# Patient Record
Sex: Female | Born: 1935 | Race: White | Hispanic: No | Smoking: Never smoker
Health system: Southern US, Community
[De-identification: ages and names within clinical notes are randomized; demographics above are authoritative.]

## PROBLEM LIST (undated history)

## (undated) DIAGNOSIS — M1990 Unspecified osteoarthritis, unspecified site: Secondary | ICD-10-CM

## (undated) DIAGNOSIS — I872 Venous insufficiency (chronic) (peripheral): Secondary | ICD-10-CM

## (undated) DIAGNOSIS — Z860100 Personal history of colon polyps, unspecified: Secondary | ICD-10-CM

## (undated) DIAGNOSIS — L97329 Non-pressure chronic ulcer of left ankle with unspecified severity: Secondary | ICD-10-CM

## (undated) DIAGNOSIS — J45909 Unspecified asthma, uncomplicated: Secondary | ICD-10-CM

## (undated) DIAGNOSIS — D6861 Antiphospholipid syndrome: Secondary | ICD-10-CM

## (undated) DIAGNOSIS — I272 Pulmonary hypertension, unspecified: Secondary | ICD-10-CM

## (undated) DIAGNOSIS — I87009 Postthrombotic syndrome without complications of unspecified extremity: Secondary | ICD-10-CM

## (undated) DIAGNOSIS — Z86718 Personal history of other venous thrombosis and embolism: Secondary | ICD-10-CM

## (undated) DIAGNOSIS — I493 Ventricular premature depolarization: Secondary | ICD-10-CM

## (undated) DIAGNOSIS — G629 Polyneuropathy, unspecified: Secondary | ICD-10-CM

## (undated) DIAGNOSIS — D649 Anemia, unspecified: Secondary | ICD-10-CM

## (undated) DIAGNOSIS — R0600 Dyspnea, unspecified: Secondary | ICD-10-CM

## (undated) DIAGNOSIS — E785 Hyperlipidemia, unspecified: Secondary | ICD-10-CM

## (undated) DIAGNOSIS — R5383 Other fatigue: Secondary | ICD-10-CM

## (undated) DIAGNOSIS — I83023 Varicose veins of left lower extremity with ulcer of ankle: Secondary | ICD-10-CM

## (undated) DIAGNOSIS — I499 Cardiac arrhythmia, unspecified: Secondary | ICD-10-CM

## (undated) HISTORY — PX: ELBOW SURGERY: SHX618

## (undated) HISTORY — PX: CHOLECYSTECTOMY: SHX55

## (undated) HISTORY — PX: COLONOSCOPY: SHX174

## (undated) HISTORY — PX: UPPER GI ENDOSCOPY: SHX6162

## (undated) HISTORY — PX: TOTAL HIP ARTHROPLASTY: SHX124

---

## 2004-02-04 HISTORY — PX: COLON SURGERY: SHX602

## 2009-01-03 ENCOUNTER — Emergency Department (HOSPITAL_BASED_OUTPATIENT_CLINIC_OR_DEPARTMENT_OTHER): Admission: EM | Admit: 2009-01-03 | Discharge: 2009-01-04 | Payer: Self-pay | Admitting: Emergency Medicine

## 2009-01-03 ENCOUNTER — Ambulatory Visit: Payer: Self-pay | Admitting: Interventional Radiology

## 2010-05-07 LAB — URINALYSIS, ROUTINE W REFLEX MICROSCOPIC
Ketones, ur: NEGATIVE mg/dL
Nitrite: NEGATIVE
Protein, ur: NEGATIVE mg/dL
Urobilinogen, UA: 0.2 mg/dL (ref 0.0–1.0)

## 2010-05-07 LAB — DIFFERENTIAL
Basophils Absolute: 0.3 10*3/uL — ABNORMAL HIGH (ref 0.0–0.1)
Lymphocytes Relative: 31 % (ref 12–46)
Monocytes Absolute: 0.7 10*3/uL (ref 0.1–1.0)
Neutro Abs: 4.5 10*3/uL (ref 1.7–7.7)
Neutrophils Relative %: 53 % (ref 43–77)

## 2010-05-07 LAB — POCT CARDIAC MARKERS
Myoglobin, poc: 60.7 ng/mL (ref 12–200)
Myoglobin, poc: 69.7 ng/mL (ref 12–200)
Troponin i, poc: <0.05 ng/mL (ref 0.00–0.09)

## 2010-05-07 LAB — APTT: aPTT: 26 seconds (ref 24–37)

## 2010-05-07 LAB — COMPREHENSIVE METABOLIC PANEL
AST: 23 U/L (ref 0–37)
CO2: 25 mEq/L (ref 19–32)
Calcium: 9.3 mg/dL (ref 8.4–10.5)
Creatinine, Ser: 0.8 mg/dL (ref 0.4–1.2)
GFR calc non Af Amer: >60 mL/min (ref >60)

## 2010-05-07 LAB — CBC
HCT: 39.3 % (ref 36.0–46.0)
MCV: 88.3 fL (ref 78.0–100.0)
Platelets: 198 10*3/uL (ref 150–400)
WBC: 8.4 10*3/uL (ref 4.0–10.5)

## 2010-05-07 LAB — POCT B-TYPE NATRIURETIC PEPTIDE (BNP): B Natriuretic Peptide, POC: 48.1 pg/mL (ref 0–100)

## 2010-05-07 LAB — PROTIME-INR
INR: 0.99 (ref 0.00–1.49)
Prothrombin Time: 13 seconds (ref 11.6–15.2)

## 2010-05-07 LAB — URINE CULTURE
Colony Count: NO GROWTH
Culture: NO GROWTH

## 2013-05-24 ENCOUNTER — Emergency Department (INDEPENDENT_AMBULATORY_CARE_PROVIDER_SITE_OTHER): Payer: Medicare Other

## 2013-05-24 ENCOUNTER — Encounter: Payer: Self-pay | Admitting: Emergency Medicine

## 2013-05-24 ENCOUNTER — Emergency Department
Admission: EM | Admit: 2013-05-24 | Discharge: 2013-05-24 | Disposition: A | Discharge status: Home / Self Care | Payer: Medicare Other | Source: Home / Self Care | Attending: Emergency Medicine | Admitting: Emergency Medicine

## 2013-05-24 DIAGNOSIS — M25569 Pain in unspecified knee: Secondary | ICD-10-CM

## 2013-05-24 DIAGNOSIS — IMO0002 Reserved for concepts with insufficient information to code with codable children: Secondary | ICD-10-CM

## 2013-05-24 DIAGNOSIS — M7052 Other bursitis of knee, left knee: Secondary | ICD-10-CM

## 2013-05-24 HISTORY — DX: Personal history of other venous thrombosis and embolism: Z86.718

## 2013-05-24 HISTORY — DX: Hyperlipidemia, unspecified: E78.5

## 2013-05-24 MED ORDER — PREDNISONE 20 MG PO TABS: 20.0000 mg | ORAL_TABLET | Freq: Two times a day (BID) | ORAL | Status: DC

## 2013-05-24 NOTE — ED Notes (Signed)
Makayla ForestShirley reports a pop in her left medial knee 2 weeks ago. She has swelling and pain. Pain is worse to touch. She has a hx of present blood clots in her left knee which she takes Coumadin for. She was seen @ [facility] 1 week ago and x-ray done, normal.

## 2013-05-24 NOTE — ED Provider Notes (Signed)
CSN: [id number]     Arrival date & time 05/24/13  [id number] History   First MD Initiated Contact with Patient 05/24/13 [phone]     Chief Complaint  Patient presents with  . Knee Pain    medial left knee    Patient is a 80 y.o. female presenting with knee pain. The history is provided by the patient.  Knee Pain Location:  Knee Time since incident:  2 weeks Injury: yes   Mechanism of injury comment:  Felt a pop left inferomedial knee while walking down a ramp Pain details:    Quality:  Dull and aching   Radiates to:  Does not radiate   Severity:  Moderate   Onset quality:  Sudden   Progression:  Unchanged Chronicity:  New Prior injury to area:  Unable to specify Relieved by:  Rest Worsened by:  Activity Ineffective treatments:  Acetaminophen Associated symptoms: decreased ROM, stiffness and swelling (Minimal.)   Associated symptoms: no back pain, no fatigue (No new fatigue), no fever, no muscle weakness, no neck pain, no numbness and no tingling   Risk factors: no known bone disorder    The pain is worse when she walks on the left knee, but she is able to weight-bear . Denies locking of left knee or giving out.  She states she was seen at [facility] a week and a half ago with negative x-ray. No other details from that visit available.  Significant past medical history, per patient, of left leg DVT 2 years ago postop hip surgery. She states she was evaluated and still follows up with Dr. Abbe AmsterdamHopkins, a specialist, and consideration was given to stopping the Coumadin, but ultimately the Coumadin was continued for prophylaxis according to the patient. She states her PCP is Dr. Sharee PimpleJudge, who monitors and prescribes Coumadin and checks protimes every month, which have been therapeutic. Past Medical History  Diagnosis Date  . Hyperlipidemia   . History of blood clots    Past Surgical History  Procedure Laterality Date  . Total hip arthroplasty    . Cholecystectomy    . Elbow surgery      Family History  Problem Relation Age of Onset  . Heart disease Mother   . Cancer Father   . Dementia Sister   . Cancer Sister     breast CA  . Diabetes Brother    History  Substance Use Topics  . Smoking status: Never Smoker   . Smokeless tobacco: Never Used  . Alcohol Use: No   OB History   Grav Para Term Preterm Abortions TAB SAB Ect Mult Living                 Review of Systems  Constitutional: Negative for fever and fatigue (No new fatigue).  Musculoskeletal: Positive for stiffness. Negative for back pain and neck pain.  All other systems reviewed and are negative.   Allergies  Penicillins  Home Medications   Prior to Admission medications   Medication Sig Start Date End Date Taking? Authorizing Provider  Biotin 300 MCG TABS Take by mouth.   Yes Historical Provider, MD  clindamycin (CLEOCIN) 300 MG capsule Take 300 mg by mouth 3 (three) times daily. "for dental work"   Yes Historical Provider, MD  levothyroxine (SYNTHROID, LEVOTHROID) 88 MCG tablet Take 88 mcg by mouth daily before breakfast.   Yes Historical Provider, MD  Omega-3 Fatty Acids (FISH OIL) 500 MG CAPS Take by mouth.   Yes Historical Provider, MD  warfarin (  COUMADIN) 5 MG tablet Take 5 mg by mouth daily.   Yes Historical Provider, MD   BP 156/83  Pulse 80  Resp 14  Ht 5\' 5"  (1.651 m)  Wt 155 lb (70.308 kg)  BMI 25.79 kg/m2  SpO2 100% Physical Exam  Nursing note and vitals reviewed. Constitutional: She is oriented to person, place, and time. She appears well-developed and well-nourished. No distress.  Alert, cooperative female. Appears younger than 80 years old.  HENT:  Head: Normocephalic and atraumatic.  Eyes: Conjunctivae and EOM are normal. Pupils are equal, round, and reactive to light. No scleral icterus.  Neck: Normal range of motion.  Cardiovascular: Normal rate.   Pulmonary/Chest: Effort normal.  Abdominal: She exhibits no distension.  Musculoskeletal: Normal range of motion.        Left knee: She exhibits normal range of motion, no ecchymosis, no laceration, no erythema and normal patellar mobility. No medial joint line, no lateral joint line and no patellar tendon tenderness noted.       Left upper leg: Normal. She exhibits no tenderness, no swelling and no edema.       Legs: Mild swelling, moderate to severe tenderness left anterior inferomedial knee. No instability. Negative Lachman's and McMurray's.  No calf tenderness. No popliteal tenderness. No cords. No leg edema. Homans sign negative.  Neurological: She is alert and oriented to person, place, and time.  Skin: Skin is warm. No rash noted.  Psychiatric: She has a normal mood and affect.    ED Course  Procedures (including critical care time) Labs Review Labs Reviewed - No data to display  Imaging Review Dg Knee Complete 4 Views Left  05/24/2013   CLINICAL DATA:  Knee pain, no injury  EXAM: LEFT KNEE - COMPLETE 4+ VIEW  COMPARISON:  None.  FINDINGS: Negative for fracture. Probable joint effusion. Mild degenerative change in the patellofemoral joint.  IMPRESSION: Probable joint effusion.  Negative for fracture.   Electronically Signed   By: Marlan Palauharles  Clark M.D.   On: 05/24/2013 11:24     MDM   1. Pes anserinus bursitis of left knee    X-ray left knee shows no acute abnormality. No fracture seen. Possible joint effusion. Clinically, diagnosis is anserine bursitis of left knee. We discussed this. Questions invited and answered. Clinically, no sign of DVT on physical exam .--No instability of the knee joint on exam.  Treatment options discussed, as well as risks, benefits, alternatives. Patient voiced understanding and agreement with the following plans: Prednisone 20 mg by mouth twice a day x 5 days. Avoid NSAIDs as she is on Coumadin, see discussion in HPI and past medical history. 6 inch Ace bandage applied, and that significantly relieved her pain. Other modalities discussed, such as alternating ice  and heat. Gradually increase range of motion. Follow-up with your orthopedist in 5-7 days if not improving, or sooner if symptoms become worse. Precautions discussed. Red flags discussed. Questions invited and answered. Patient voiced understanding and agreement.     Lajean Manesavid Massey, MD 05/24/13 [phone]

## 2014-10-06 ENCOUNTER — Emergency Department (INDEPENDENT_AMBULATORY_CARE_PROVIDER_SITE_OTHER): Payer: Medicare Other

## 2014-10-06 ENCOUNTER — Emergency Department (INDEPENDENT_AMBULATORY_CARE_PROVIDER_SITE_OTHER)
Admission: EM | Admit: 2014-10-06 | Discharge: 2014-10-06 | Disposition: A | Discharge status: Home / Self Care | Payer: Medicare Other | Source: Home / Self Care | Attending: Family Medicine | Admitting: Family Medicine

## 2014-10-06 ENCOUNTER — Encounter: Payer: Self-pay | Admitting: Emergency Medicine

## 2014-10-06 DIAGNOSIS — M545 Low back pain: Secondary | ICD-10-CM | POA: Diagnosis not present

## 2014-10-06 DIAGNOSIS — R358 Other polyuria: Secondary | ICD-10-CM | POA: Diagnosis not present

## 2014-10-06 DIAGNOSIS — R3589 Other polyuria: Secondary | ICD-10-CM

## 2014-10-06 DIAGNOSIS — M438X6 Other specified deforming dorsopathies, lumbar region: Secondary | ICD-10-CM | POA: Diagnosis not present

## 2014-10-06 LAB — POCT URINALYSIS DIP (MANUAL ENTRY)
BILIRUBIN UA: NEGATIVE
BILIRUBIN UA: NEGATIVE
Blood, UA: NEGATIVE
Glucose, UA: NEGATIVE
LEUKOCYTES UA: NEGATIVE
NITRITE UA: NEGATIVE
PH UA: 5.5 (ref 5–8)
PROTEIN UA: NEGATIVE
Spec Grav, UA: <=1.005 (ref 1.005–1.03)
Urobilinogen, UA: 0.2 (ref 0–1)

## 2014-10-06 MED ORDER — PREDNISONE 20 MG PO TABS
20.0000 mg | ORAL_TABLET | Freq: Two times a day (BID) | ORAL | Status: DC
Start: 2014-10-06 — End: 2018-12-17

## 2014-10-06 NOTE — Discharge Instructions (Signed)
Apply ice pack for 20 to 30 minutes, 3 to 4 times daily  Continue until pain decreases.  May take Tylenol , two tabs, two or three times daily.   Back Pain, Adult Low back pain is very common. About 1 in 5 people have back pain.The cause of low back pain is rarely dangerous. The pain often gets better over time.About half of people with a sudden onset of back pain feel better in just 2 weeks. About 8 in 10 people feel better by 6 weeks.  CAUSES Some common causes of back pain include:  Strain of the muscles or ligaments supporting the spine.  Wear and tear (degeneration) of the spinal discs.  Arthritis.  Direct injury to the back. DIAGNOSIS Most of the time, the direct cause of low back pain is not known.However, back pain can be treated effectively even when the exact cause of the pain is unknown.Answering your caregiver's questions about your overall health and symptoms is one of the most accurate ways to make sure the cause of your pain is not dangerous. If your caregiver needs more information, he or she may order lab work or imaging tests (X-rays or MRIs).However, even if imaging tests show changes in your back, this usually does not require surgery. HOME CARE INSTRUCTIONS For many people, back pain returns.Since low back pain is rarely dangerous, it is often a condition that people can learn to [facility] their own.   Remain active. It is stressful on the back to sit or stand in one place. Do not sit, drive, or stand in one place for more than 30 minutes at a time. Take short walks on level surfaces as soon as pain allows.Try to increase the length of time you walk each day.  Do not stay in bed.Resting more than 1 or 2 days can delay your recovery.  Do not avoid exercise or work.Your body is made to move.It is not dangerous to be active, even though your back may hurt.Your back will likely heal faster if you return to being active before your pain is gone.  Pay  attention to your body when you bend and lift. Many people have less discomfortwhen lifting if they bend their knees, keep the load close to their bodies,and avoid twisting. Often, the most comfortable positions are those that put less stress on your recovering back.  Find a comfortable position to sleep. Use a firm mattress and lie on your side with your knees slightly bent. If you lie on your back, put a pillow under your knees.  Only take over-the-counter or prescription medicines as directed by your caregiver. Over-the-counter medicines to reduce pain and inflammation are often the most helpful.Your caregiver may prescribe muscle relaxant drugs.These medicines help dull your pain so you can more quickly return to your normal activities and healthy exercise.  Put ice on the injured area.  Put ice in a plastic bag.  Place a towel between your skin and the bag.  Leave the ice on for 15-20 minutes, 03-04 times a day for the first 2 to 3 days. After that, ice and heat may be alternated to reduce pain and spasms.  Ask your caregiver about trying back exercises and gentle massage. This may be of some benefit.  Avoid feeling anxious or stressed.Stress increases muscle tension and can worsen back pain.It is important to recognize when you are anxious or stressed and learn ways to manage it.Exercise is a great option. SEEK MEDICAL CARE IF:  You have pain  that is not relieved with rest or medicine.  You have pain that does not improve in 1 week.  You have new symptoms.  You are generally not feeling well. SEEK IMMEDIATE MEDICAL CARE IF:   You have pain that radiates from your back into your legs.  You develop new bowel or bladder control problems.  You have unusual weakness or numbness in your arms or legs.  You develop nausea or vomiting.  You develop abdominal pain.  You feel faint. Document Released: 01/20/2005 Document Revised: 07/22/2011 Document Reviewed:  05/24/2013 [facility] Patient Information 2015 [place], [place]. This information is not intended to replace advice given to you by your health care provider. Make sure you discuss any questions you have with your health care provider.

## 2014-10-06 NOTE — ED Notes (Signed)
Low to mid back pain radiates around her waist x 3 days, Having difficulty walking, painful to get up and down

## 2014-10-06 NOTE — ED Provider Notes (Signed)
CSN: [id number]     Arrival date & time 10/06/14  1517 History   First MD Initiated Contact with Patient 10/06/14 1619     Chief Complaint  Patient presents with  . Back Pain      HPI Comments: Two days ago at 9am while arising from a sitting position, patient suddenly developed bilateral lower back pain.  The pain has persisted and now radiates anteriorly to her lower abdomen.  The pain decreases when she sits, and is worse if she leans backward while sitting, and worse when arising from a sitting position.  Standing causes less pain.  She recalls no injury or change in activities.  She denies bowel or bladder dysfunction, and no saddle numbness.  She has noted some increase in urine frequency without dysuria.  No fevers, chills, and sweats.  No weight loss    Patient is a 81 y.o. female presenting with back pain. The history is provided by the patient.  Back Pain Location:  Lumbar spine Quality:  Aching Radiates to: lower abdomen. Pain severity:  Mild Pain is:  Same all the time Onset quality:  Sudden Duration:  3 days Timing:  Constant Progression:  Worsening Chronicity:  New Context: not recent illness and not recent injury   Relieved by:  Nothing Exacerbated by: arising from sitting position. Ineffective treatments: "Icy Hot" Associated symptoms: abdominal pain and headaches   Associated symptoms: no abdominal swelling, no bladder incontinence, no bowel incontinence, no chest pain, no dysuria, no fever, no leg pain, no numbness, no paresthesias, no pelvic pain, no perianal numbness, no tingling, no weakness and no weight loss   Risk factors: menopause     Past Medical History  Diagnosis Date  . Hyperlipidemia   . History of blood clots    Past Surgical History  Procedure Laterality Date  . Total hip arthroplasty    . Cholecystectomy    . Elbow surgery     Family History  Problem Relation Age of Onset  . Heart disease Mother   . Cancer Father   . Dementia Sister   .  Cancer Sister     breast CA  . Diabetes Brother    Social History  Substance Use Topics  . Smoking status: Never Smoker   . Smokeless tobacco: Never Used  . Alcohol Use: No   OB History    No data available     Review of Systems  Constitutional: Negative for fever and weight loss.  Cardiovascular: Negative for chest pain.  Gastrointestinal: Positive for abdominal pain. Negative for bowel incontinence.  Genitourinary: Negative for bladder incontinence, dysuria and pelvic pain.  Musculoskeletal: Positive for back pain.  Neurological: Positive for headaches. Negative for tingling, weakness, numbness and paresthesias.  All other systems reviewed and are negative.   Allergies  Penicillins  Home Medications   Prior to Admission medications   Medication Sig Start Date End Date Taking? Authorizing Provider  tizanidine (ZANAFLEX) 2 MG capsule Take 2 mg by mouth 3 (three) times daily.   Yes Historical Provider, MD  Biotin 300 MCG TABS Take by mouth.    Historical Provider, MD  clindamycin (CLEOCIN) 300 MG capsule Take 300 mg by mouth 3 (three) times daily. "for dental work"    Ecologist, MD  levothyroxine (SYNTHROID, LEVOTHROID) 88 MCG tablet Take 88 mcg by mouth daily before breakfast.    Historical Provider, MD  Omega-3 Fatty Acids (FISH OIL) 500 MG CAPS Take by mouth.    Historical Provider, MD  predniSONE (DELTASONE) 20 MG tablet Take 1 tablet (20 mg total) by mouth 2 (two) times daily. Take with food. 10/06/14   Lattie Haw, MD  warfarin (COUMADIN) 5 MG tablet Take 5 mg by mouth daily.    Historical Provider, MD   Meds Ordered and Administered this Visit  Medications - No data to display  BP 173/94 mmHg  Pulse 95  Temp(Src) 98.3 F (36.8 C) (Oral)  Ht 5\' 5"  (1.651 m)  Wt 157 lb (71.215 kg)  BMI 26.13 kg/m2  SpO2 100% No data found.   Physical Exam  Constitutional: She is oriented to person, place, and time. She appears well-developed and well-nourished. No  distress.  HENT:  Head: Normocephalic.  Mouth/Throat: Oropharynx is clear and moist.  Eyes: Conjunctivae are normal. Pupils are equal, round, and reactive to light.  Neck: Neck supple.  Cardiovascular: Normal heart sounds.   Pulmonary/Chest: Breath sounds normal.  Abdominal: Bowel sounds are normal. She exhibits no distension and no mass. There is no tenderness.  Musculoskeletal:       Lumbar back: She exhibits decreased range of motion, tenderness and bony tenderness. She exhibits no swelling.       Back:  Back:    Can heel/toe walk and squat without difficulty.  Tenderness in the midline and bilateral paraspinous muscles from L1 to Sacral area.  Straight leg raising test is negative.  Sitting knee extension test is negative.  Strength and sensation in the lower extremities is normal.  Patellar and achilles reflexes are normal     Right hip decreased range of motion internal/external rotation (History of hip replacement)  Lymphadenopathy:    She has no cervical adenopathy.  Neurological: She is alert and oriented to person, place, and time.  Skin: Skin is warm and dry. No rash noted.  Nursing note and vitals reviewed.   ED Course  Procedures  None    Labs Reviewed  POCT URINALYSIS DIP (MANUAL ENTRY) - Abnormal; Notable for the following:    Color, UA straw (*)    All other components within normal limits    Imaging Review Dg Lumbar Spine Complete  10/06/2014   CLINICAL DATA:  Low back pain for 2 days.  EXAM: LUMBAR SPINE - COMPLETE 4+ VIEW  COMPARISON:  MRI report 12/21/2001, images not available.  FINDINGS: Surgical clips in the right upper abdomen. Negative for a pars defect. Prominent facet arthropathy at L5-S1. Minimal anterolisthesis at L4-L5 likely secondary to facet arthropathy. There is deformity along the superior endplate of L2. The other vertebral body heights are maintained. Mild disc space narrowing at L5-S1.  IMPRESSION: Deformity of the L2 superior endplate.  Previous MRI described a depression of the L2 superior endplate due to a Schmorl's node. However, a mild compression fracture at L2 cannot be excluded and recommend clinical correlation in this area.  Degenerative facet disease in the lower lumbar spine.   Electronically Signed   By: Richarda Overlie M.D.   On: 10/06/2014 17:26       MDM   1. Low back pain without sciatica, unspecified back pain laterality; concern for possible compression fracture L2   2. Polyuria:  Normal urinalysis    Begin prednisone burst. Apply ice pack for 20 to 30 minutes, 3 to 4 times daily  Continue until pain decreases.  May take Tylenol 500mg , two tabs, two or three times daily. Followup with Dr. Rodney Langton or Dr. Clementeen [facility] ([facility]) as soon as possible for further evaluation  Lattie Haw, MD 10/06/14 [phone]

## 2014-10-09 ENCOUNTER — Telehealth: Payer: Self-pay | Admitting: Emergency Medicine

## 2014-11-23 ENCOUNTER — Emergency Department (HOSPITAL_BASED_OUTPATIENT_CLINIC_OR_DEPARTMENT_OTHER): Payer: Medicare Other

## 2014-11-23 ENCOUNTER — Emergency Department (HOSPITAL_BASED_OUTPATIENT_CLINIC_OR_DEPARTMENT_OTHER)
Admission: EM | Admit: 2014-11-23 | Discharge: 2014-11-23 | Disposition: A | Discharge status: Home / Self Care | Payer: Medicare Other | Attending: Emergency Medicine | Admitting: Emergency Medicine

## 2014-11-23 ENCOUNTER — Encounter (HOSPITAL_BASED_OUTPATIENT_CLINIC_OR_DEPARTMENT_OTHER): Payer: Self-pay | Admitting: Emergency Medicine

## 2014-11-23 DIAGNOSIS — Z880 Allergy status to penicillin: Secondary | ICD-10-CM | POA: Insufficient documentation

## 2014-11-23 DIAGNOSIS — Z792 Long term (current) use of antibiotics: Secondary | ICD-10-CM | POA: Insufficient documentation

## 2014-11-23 DIAGNOSIS — Z7901 Long term (current) use of anticoagulants: Secondary | ICD-10-CM | POA: Insufficient documentation

## 2014-11-23 DIAGNOSIS — Z86718 Personal history of other venous thrombosis and embolism: Secondary | ICD-10-CM | POA: Diagnosis not present

## 2014-11-23 DIAGNOSIS — Z79899 Other long term (current) drug therapy: Secondary | ICD-10-CM | POA: Insufficient documentation

## 2014-11-23 DIAGNOSIS — E785 Hyperlipidemia, unspecified: Secondary | ICD-10-CM | POA: Diagnosis not present

## 2014-11-23 DIAGNOSIS — R079 Chest pain, unspecified: Secondary | ICD-10-CM | POA: Insufficient documentation

## 2014-11-23 DIAGNOSIS — Z7952 Long term (current) use of systemic steroids: Secondary | ICD-10-CM | POA: Diagnosis not present

## 2014-11-23 DIAGNOSIS — R0602 Shortness of breath: Secondary | ICD-10-CM | POA: Diagnosis present

## 2014-11-23 LAB — PROTIME-INR
INR: 2.83 — ABNORMAL HIGH (ref 0.00–1.49)
Prothrombin Time: 29.3 seconds — ABNORMAL HIGH (ref 11.6–15.2)

## 2014-11-23 LAB — LIPASE, BLOOD: Lipase: 22 U/L (ref 11–51)

## 2014-11-23 LAB — URINALYSIS, ROUTINE W REFLEX MICROSCOPIC
BILIRUBIN URINE: NEGATIVE
GLUCOSE, UA: NEGATIVE mg/dL
KETONES UR: NEGATIVE mg/dL
LEUKOCYTES UA: NEGATIVE
Nitrite: NEGATIVE
PROTEIN: NEGATIVE mg/dL
Specific Gravity, Urine: 1.013 (ref 1.005–1.030)
Urobilinogen, UA: 0.2 mg/dL (ref 0.0–1.0)
pH: 5.5 (ref 5.0–8.0)

## 2014-11-23 LAB — CBC
HCT: 40.2 % (ref 36.0–46.0)
Hemoglobin: 12.9 g/dL (ref 12.0–15.0)
MCH: 28.1 pg (ref 26.0–34.0)
MCHC: 32.1 g/dL (ref 30.0–36.0)
MCV: 87.6 fL (ref 78.0–100.0)
PLATELETS: 277 10*3/uL (ref 150–400)
RBC: 4.59 MIL/uL (ref 3.87–5.11)
RDW: 14.8 % (ref 11.5–15.5)
WBC: 8.5 10*3/uL (ref 4.0–10.5)

## 2014-11-23 LAB — COMPREHENSIVE METABOLIC PANEL
ALK PHOS: 75 U/L (ref 38–126)
ALT: 13 U/L — AB (ref 14–54)
AST: 20 U/L (ref 15–41)
Albumin: 4.1 g/dL (ref 3.5–5.0)
Anion gap: 7 (ref 5–15)
BUN: 19 mg/dL (ref 6–20)
CALCIUM: 9 mg/dL (ref 8.9–10.3)
CO2: 26 mmol/L (ref 22–32)
CREATININE: 0.63 mg/dL (ref 0.44–1.00)
Chloride: 105 mmol/L (ref 101–111)
GFR calc non Af Amer: >60 mL/min (ref >60)
Glucose, Bld: 107 mg/dL — ABNORMAL HIGH (ref 65–99)
Potassium: 4 mmol/L (ref 3.5–5.1)
SODIUM: 138 mmol/L (ref 135–145)
Total Bilirubin: 0.4 mg/dL (ref 0.3–1.2)
Total Protein: 7.3 g/dL (ref 6.5–8.1)

## 2014-11-23 LAB — BRAIN NATRIURETIC PEPTIDE: B Natriuretic Peptide: 113.9 pg/mL — ABNORMAL HIGH (ref 0.0–100.0)

## 2014-11-23 LAB — URINE MICROSCOPIC-ADD ON

## 2014-11-23 LAB — TROPONIN I
Troponin I: <0.03 ng/mL (ref <0.031)
Troponin I: <0.03 ng/mL (ref <0.031)

## 2014-11-23 MED ORDER — NITROGLYCERIN 0.4 MG SL SUBL: 0.4000 mg | SUBLINGUAL_TABLET | SUBLINGUAL | Status: DC | PRN

## 2014-11-23 MED ORDER — SODIUM CHLORIDE 0.9 % IV BOLUS (SEPSIS)
1000.0000 mL | Freq: Once | INTRAVENOUS | Status: AC
  Administered 2014-11-23: 1000 mL via INTRAVENOUS

## 2014-11-23 MED ORDER — ASPIRIN 325 MG PO TABS
325.0000 mg | ORAL_TABLET | Freq: Once | ORAL | Status: AC
  Administered 2014-11-23: 325 mg via ORAL
  Filled 2014-11-23: qty 1

## 2014-11-23 MED ORDER — GI COCKTAIL ~~LOC~~
30.0000 mL | Freq: Once | ORAL | Status: AC
  Administered 2014-11-23: 30 mL via ORAL
  Filled 2014-11-23: qty 30

## 2014-11-23 NOTE — ED Notes (Signed)
Pt in c/o of multiple things including, headache, lightheadedness, SOB, nausea, and abdominal pain. Pt is alert, oriented, interactive, and in NAD. Breathing equal and unlabored.

## 2014-11-23 NOTE — ED Provider Notes (Signed)
Care assumed from Dr. Littie DeedsGentry at shift change.  Patient awaiting UA and second troponin.  Both have returned unremarkable.  She has had no further discomfort while in the ED.  Will discharge, to [place].  To return to the ED in the meantime if symptoms worsen.  Geoffery Lyonsouglas Undra Harriman, MD 11/23/14 [phone]

## 2014-11-23 NOTE — Discharge Instructions (Signed)
Call your cardiologist tomorrow to schedule a follow-up appointment in the next few days.  Return to the ER if symptoms significantly worsen or change.   Chest Wall Pain Chest wall pain is pain in or around the bones and muscles of your chest. Sometimes, an injury causes this pain. Sometimes, the cause may not be known. This pain may take several weeks or longer to get better. HOME CARE INSTRUCTIONS  Pay attention to any changes in your symptoms. Take these actions to help with your pain:   Rest as told by your health care provider.   Avoid activities that cause pain. These include any activities that use your chest muscles or your abdominal and side muscles to lift heavy items.   If directed, apply ice to the painful area:  Put ice in a plastic bag.  Place a towel between your skin and the bag.  Leave the ice on for 20 minutes, 2-3 times per day.  Take over-the-counter and prescription medicines only as told by your health care provider.  Do not use tobacco products, including cigarettes, chewing tobacco, and e-cigarettes. If you need help quitting, ask your health care provider.  Keep all follow-up visits as told by your health care provider. This is important. SEEK MEDICAL CARE IF:  You have a fever.  Your chest pain becomes worse.  You have new symptoms. SEEK IMMEDIATE MEDICAL CARE IF:  You have nausea or vomiting.  You feel sweaty or light-headed.  You have a cough with phlegm (sputum) or you cough up blood.  You develop shortness of breath.   This information is not intended to replace advice given to you by your health care provider. Make sure you discuss any questions you have with your health care provider.   Document Released: 01/20/2005 Document Revised: 10/11/2014 Document Reviewed: 04/17/2014 Elsevier Interactive Patient Education [employer]2016 [employer]

## 2014-11-23 NOTE — ED Provider Notes (Signed)
CSN: [id number]     Arrival date & time 11/23/14  1334 History   First MD Initiated Contact with Patient 11/23/14 1346     Chief Complaint  Patient presents with  . Shortness of Breath  . Nausea     (Consider location/radiation/quality/duration/timing/severity/associated sxs/prior Treatment) Patient is a 81 y.o. female presenting with shortness of breath.  Shortness of Breath Severity:  Moderate Onset quality:  Gradual Duration:  1 day Timing:  Constant Progression:  Unchanged Chronicity:  Recurrent Context comment:  Spontaneous Relieved by:  Nothing Worsened by:  Exertion Ineffective treatments:  None tried Associated symptoms: abdominal pain (generalized, mild)   Associated symptoms: no cough, no diaphoresis and no fever     Past Medical History  Diagnosis Date  . Hyperlipidemia   . History of blood clots    Past Surgical History  Procedure Laterality Date  . Total hip arthroplasty    . Cholecystectomy    . Elbow surgery     Family History  Problem Relation Age of Onset  . Heart disease Mother   . Cancer Father   . Dementia Sister   . Cancer Sister     breast CA  . Diabetes Brother    Social History  Substance Use Topics  . Smoking status: Never Smoker   . Smokeless tobacco: Never Used  . Alcohol Use: No   OB History    No data available     Review of Systems  Constitutional: Negative for fever and diaphoresis.  Respiratory: Positive for shortness of breath. Negative for cough.   Gastrointestinal: Positive for abdominal pain (generalized, mild).  All other systems reviewed and are negative.     Allergies  Penicillins  Home Medications   Prior to Admission medications   Medication Sig Start Date End Date Taking? Authorizing Provider  Biotin 300 MCG TABS Take by mouth.    Historical Provider, MD  clindamycin (CLEOCIN) 300 MG capsule Take 300 mg by mouth 3 (three) times daily. "for dental work"    Ecologist, MD  levothyroxine  (SYNTHROID, LEVOTHROID) 88 MCG tablet Take 88 mcg by mouth daily before breakfast.    Historical Provider, MD  Omega-3 Fatty Acids (FISH OIL) 500 MG CAPS Take by mouth.    Historical Provider, MD  predniSONE (DELTASONE) 20 MG tablet Take 1 tablet (20 mg total) by mouth 2 (two) times daily. Take with food. 10/06/14   Lattie Haw, MD  tizanidine (ZANAFLEX) 2 MG capsule Take 2 mg by mouth 3 (three) times daily.    Historical Provider, MD  warfarin (COUMADIN) 5 MG tablet Take 5 mg by mouth daily.    Historical Provider, MD   BP 159/78 mmHg  Pulse 71  Temp(Src) 98.3 F (36.8 C) (Oral)  Resp 18  Ht  (1.651 m)  Wt 179 lb (81.194 kg)  BMI 29.79 kg/m2  SpO2 98% Physical Exam  Constitutional: She is oriented to person, place, and time. She appears well-developed and well-nourished.  HENT:  Head: Normocephalic and atraumatic.  Right Ear: External ear normal.  Left Ear: External ear normal.  Eyes: Conjunctivae and EOM are normal. Pupils are equal, round, and reactive to light.  Neck: Normal range of motion. Neck supple.  Cardiovascular: Normal rate, regular rhythm, normal heart sounds and intact distal pulses.   Pulmonary/Chest: Effort normal and breath sounds normal.  Abdominal: Soft. Bowel sounds are normal. There is generalized tenderness (mild).  Musculoskeletal: Normal range of motion.  Neurological: She is alert and oriented  to person, place, and time.  Skin: Skin is warm and dry.  Vitals reviewed.   ED Course  Procedures (including critical care time) Labs Review Labs Reviewed  COMPREHENSIVE METABOLIC PANEL - Abnormal; Notable for the following:    Glucose, Bld 107 (*)    ALT 13 (*)    All other components within normal limits  URINALYSIS, ROUTINE W REFLEX MICROSCOPIC (NOT AT [facility]) - Abnormal; Notable for the following:    Hgb urine dipstick TRACE (*)    All other components within normal limits  PROTIME-INR - Abnormal; Notable for the following:    Prothrombin Time 29.3  (*)    INR 2.83 (*)    All other components within normal limits  BRAIN NATRIURETIC PEPTIDE - Abnormal; Notable for the following:    B Natriuretic Peptide 113.9 (*)    All other components within normal limits  LIPASE, BLOOD  CBC  TROPONIN I  TROPONIN I  URINE MICROSCOPIC-ADD ON    Imaging Review Dg Chest 2 View  11/23/2014  CLINICAL DATA:  Chest pain, nausea and shortness of breath EXAM: CHEST  2 VIEW COMPARISON:  Chest x-ray 8 01/03/2009 FINDINGS: The cardiac silhouette, mediastinal and hilar contours are no and stable. The lungs are clear. No pleural effusion or pulmonary nodules. The bony thorax is intact. IMPRESSION: No acute cardiopulmonary findings. Electronically Signed   By: Rudie MeyerP.  Gallerani M.D.   On: 11/23/2014 14:18   I have personally reviewed and evaluated these images and lab results as part of my medical decision-making.   EKG Interpretation   Date/Time:  Thursday November 23 2014 13:50:00 EDT Ventricular Rate:  77 PR Interval:  144 QRS Duration: 88 QT Interval:  404 QTC Calculation: 457 R Axis:   3 Text Interpretation:  Normal sinus rhythm Normal ECG No significant change  since last tracing Confirmed by Mirian MoGentry, Matthew [phone]([id number]) on 11/23/2014  1:57:11 PM      MDM   Final diagnoses:  Chest pain, unspecified chest pain type    81 y.o. female with pertinent PMH of HLD, prior DVT on coumadin presents with dyspnea, chest pressure, abd pain as above.  Symptoms began at rest abruptly yesterday and have continued today.  Pt has had similar symptoms in the past and [facility], however had a negative wu with negative stress test and was lost to fu.  On arrival pt in very mild pain, well appearing with stable vitals.    Pt care to Dr. Judd Lienelo pending second trop   I have reviewed all laboratory and imaging studies if ordered as above  1. Chest pain, unspecified chest pain type         Mirian MoMatthew Gentry, MD 11/24/14 [id number]

## 2015-07-15 ENCOUNTER — Emergency Department (INDEPENDENT_AMBULATORY_CARE_PROVIDER_SITE_OTHER)
Admission: EM | Admit: 2015-07-15 | Discharge: 2015-07-15 | Disposition: A | Discharge status: Home / Self Care | Payer: Medicare Other | Source: Home / Self Care | Attending: Family Medicine | Admitting: Family Medicine

## 2015-07-15 ENCOUNTER — Encounter: Payer: Self-pay | Admitting: Emergency Medicine

## 2015-07-15 DIAGNOSIS — R609 Edema, unspecified: Secondary | ICD-10-CM | POA: Diagnosis not present

## 2015-07-15 DIAGNOSIS — S80812A Abrasion, left lower leg, initial encounter: Secondary | ICD-10-CM

## 2015-07-15 NOTE — ED Notes (Signed)
Reports scratching lateral left lower leg on basket yesterday; bled briskly, and when that stopped it continued to ooze serous fluid in large amount. No pain. Had tdap about 5 years ago.

## 2015-07-15 NOTE — ED Provider Notes (Signed)
CSN: [id number]     Arrival date & time 07/15/15  1406 History   First MD Initiated Contact with Patient 07/15/15 1542     Chief Complaint  Patient presents with  . Extremity Laceration      HPI Comments: Patient reports that she abraded her left lower leg on a basket yesterday.  She had some bleeding initially, but since then she has had persistent seepage of clear fluid.  Her last Tdap was 5 years ago.  Patient is a 82 y.o. female presenting with skin laceration. The history is provided by the patient.  Laceration Location:  Leg Leg laceration location:  L lower leg Length (cm):  2 Depth:  Cutaneous Quality: jagged   Quality comment:  Abrasion Bleeding: controlled   Time since incident:  1 day Laceration mechanism:  Blunt object Pain details:    Quality:  Aching   Severity:  No pain Foreign body present:  No foreign bodies Relieved by:  Nothing Ineffective treatments:  Pressure Tetanus status:  Up to date   Past Medical History  Diagnosis Date  . Hyperlipidemia   . History of blood clots    Past Surgical History  Procedure Laterality Date  . Total hip arthroplasty    . Cholecystectomy    . Elbow surgery     Family History  Problem Relation Age of Onset  . Heart disease Mother   . Cancer Father   . Dementia Sister   . Cancer Sister     breast CA  . Diabetes Brother    Social History  Substance Use Topics  . Smoking status: Never Smoker   . Smokeless tobacco: Never Used  . Alcohol Use: No   OB History    No data available     Review of Systems  All other systems reviewed and are negative.   Allergies  Penicillins  Home Medications   Prior to Admission medications   Medication Sig Start Date End Date Taking? Authorizing Provider  Biotin 300 MCG TABS Take by mouth.    Historical Provider, MD  clindamycin (CLEOCIN) 300 MG capsule Take 300 mg by mouth 3 (three) times daily. "for dental work"    EcologistHistorical Provider, MD  levothyroxine (SYNTHROID,  LEVOTHROID) 88 MCG tablet Take 88 mcg by mouth daily before breakfast.    Historical Provider, MD  Omega-3 Fatty Acids (FISH OIL) 500 MG CAPS Take by mouth.    Historical Provider, MD  predniSONE (DELTASONE) 20 MG tablet Take 1 tablet (20 mg total) by mouth 2 (two) times daily. Take with food. 10/06/14   Lattie HawStephen A Demond Shallenberger, MD  tizanidine (ZANAFLEX) 2 MG capsule Take 2 mg by mouth 3 (three) times daily.    Historical Provider, MD  warfarin (COUMADIN) 5 MG tablet Take 5 mg by mouth daily.    Historical Provider, MD   Meds Ordered and Administered this Visit  Medications - No data to display  BP 134/82 mmHg  Pulse 92  Temp(Src) 98.2 F (36.8 C) (Oral)  Resp 16  Ht 5\' 4"  (1.626 m)  Wt 179 lb (81.194 kg)  BMI 30.71 kg/m2  SpO2 95% No data found.   Physical Exam  Constitutional: She is oriented to person, place, and time. She appears well-developed and well-nourished. No distress.  HENT:  Head: Atraumatic.  Eyes: Conjunctivae are normal. Pupils are equal, round, and reactive to light.  Cardiovascular: Normal heart sounds.   Pulmonary/Chest: Breath sounds normal.  Musculoskeletal: She exhibits edema. She exhibits no tenderness.  Left lower leg: She exhibits edema. She exhibits no tenderness and no swelling.       Legs: Left lower leg pre-tibial area has a minimal superficial abrasion without bleeding, erythema, swelling, or tenderness.  However, her lower legs have 3+ pitting edema, and the wound is exuding a steady amount of clear serous effusion.  Neurological: She is alert and oriented to person, place, and time.  Skin: Skin is warm and dry. No erythema.  Nursing note and vitals reviewed.   ED Course  Procedures none   MDM   1. Abrasion of left leg, initial encounter   2. Dependent edema    No evidence infection.  Although wound is minimal, it is complicated by seepage of edema fluid.  Patient reports that she has compression stockings at home but is unable to apply them  because of her recent left hip replacement in May 2017. Applied ace wraps in graduated compression fashion to below the knee. Advised to elevate leg as much as possible. Change bandage daily with non-stick dressing such as Telfa until healed.  Will write order for a home health agency to daily apply ace wraps in a graduated compression fashion to below knee to control edema.  Return (or follow-up with family doctor) for increasing pain, redness, swelling.    Lattie Haw, MD 07/22/15 [phone]

## 2015-07-22 NOTE — Discharge Instructions (Signed)
Change bandage daily with non-stick dressing such as Telfa until healed.  Apply ace wraps in a graduated compression fashion to below knee to control edema.  Return (or follow-up with family doctor) for increasing pain, redness, swelling.   Peripheral Edema You have swelling in your legs (peripheral edema). This swelling is due to excess accumulation of salt and water in your body. Edema may be a sign of heart, kidney or liver disease, or a side effect of a medication. It may also be due to problems in the leg veins. Elevating your legs and using special support stockings may be very helpful, if the cause of the swelling is due to poor venous circulation. Avoid long periods of standing, whatever the cause. Treatment of edema depends on identifying the cause. Chips, pretzels, pickles and other salty foods should be avoided. Restricting salt in your diet is almost always needed. Water pills (diuretics) are often used to remove the excess salt and water from your body via urine. These medicines prevent the kidney from reabsorbing sodium. This increases urine flow. Diuretic treatment may also result in lowering of potassium levels in your body. Potassium supplements may be needed if you have to use diuretics daily. Daily weights can help you keep track of your progress in clearing your edema. You should call your caregiver for follow up care as recommended. SEEK IMMEDIATE MEDICAL CARE IF:   You have increased swelling, pain, redness, or heat in your legs.  You develop shortness of breath, especially when lying down.  You develop chest or abdominal pain, weakness, or fainting.  You have a fever.   This information is not intended to replace advice given to you by your health care provider. Make sure you discuss any questions you have with your health care provider.   Document Released: 02/28/2004 Document Revised: 04/14/2011 Document Reviewed: 08/02/2014 Elsevier Interactive Patient Education [id number]  [employer]

## 2016-02-04 HISTORY — PX: CATARACT EXTRACTION W/ INTRAOCULAR LENS  IMPLANT, BILATERAL: SHX1307

## 2016-10-29 IMAGING — DX DG CHEST 2V
2 series · 2 of 2 positions shown · non-contrast
Comparison: Chest x-ray [DATE] 01/03/2009

CLINICAL DATA: Chest pain, nausea and shortness of breath

EXAM:
CHEST  2 VIEW

[chest pa]
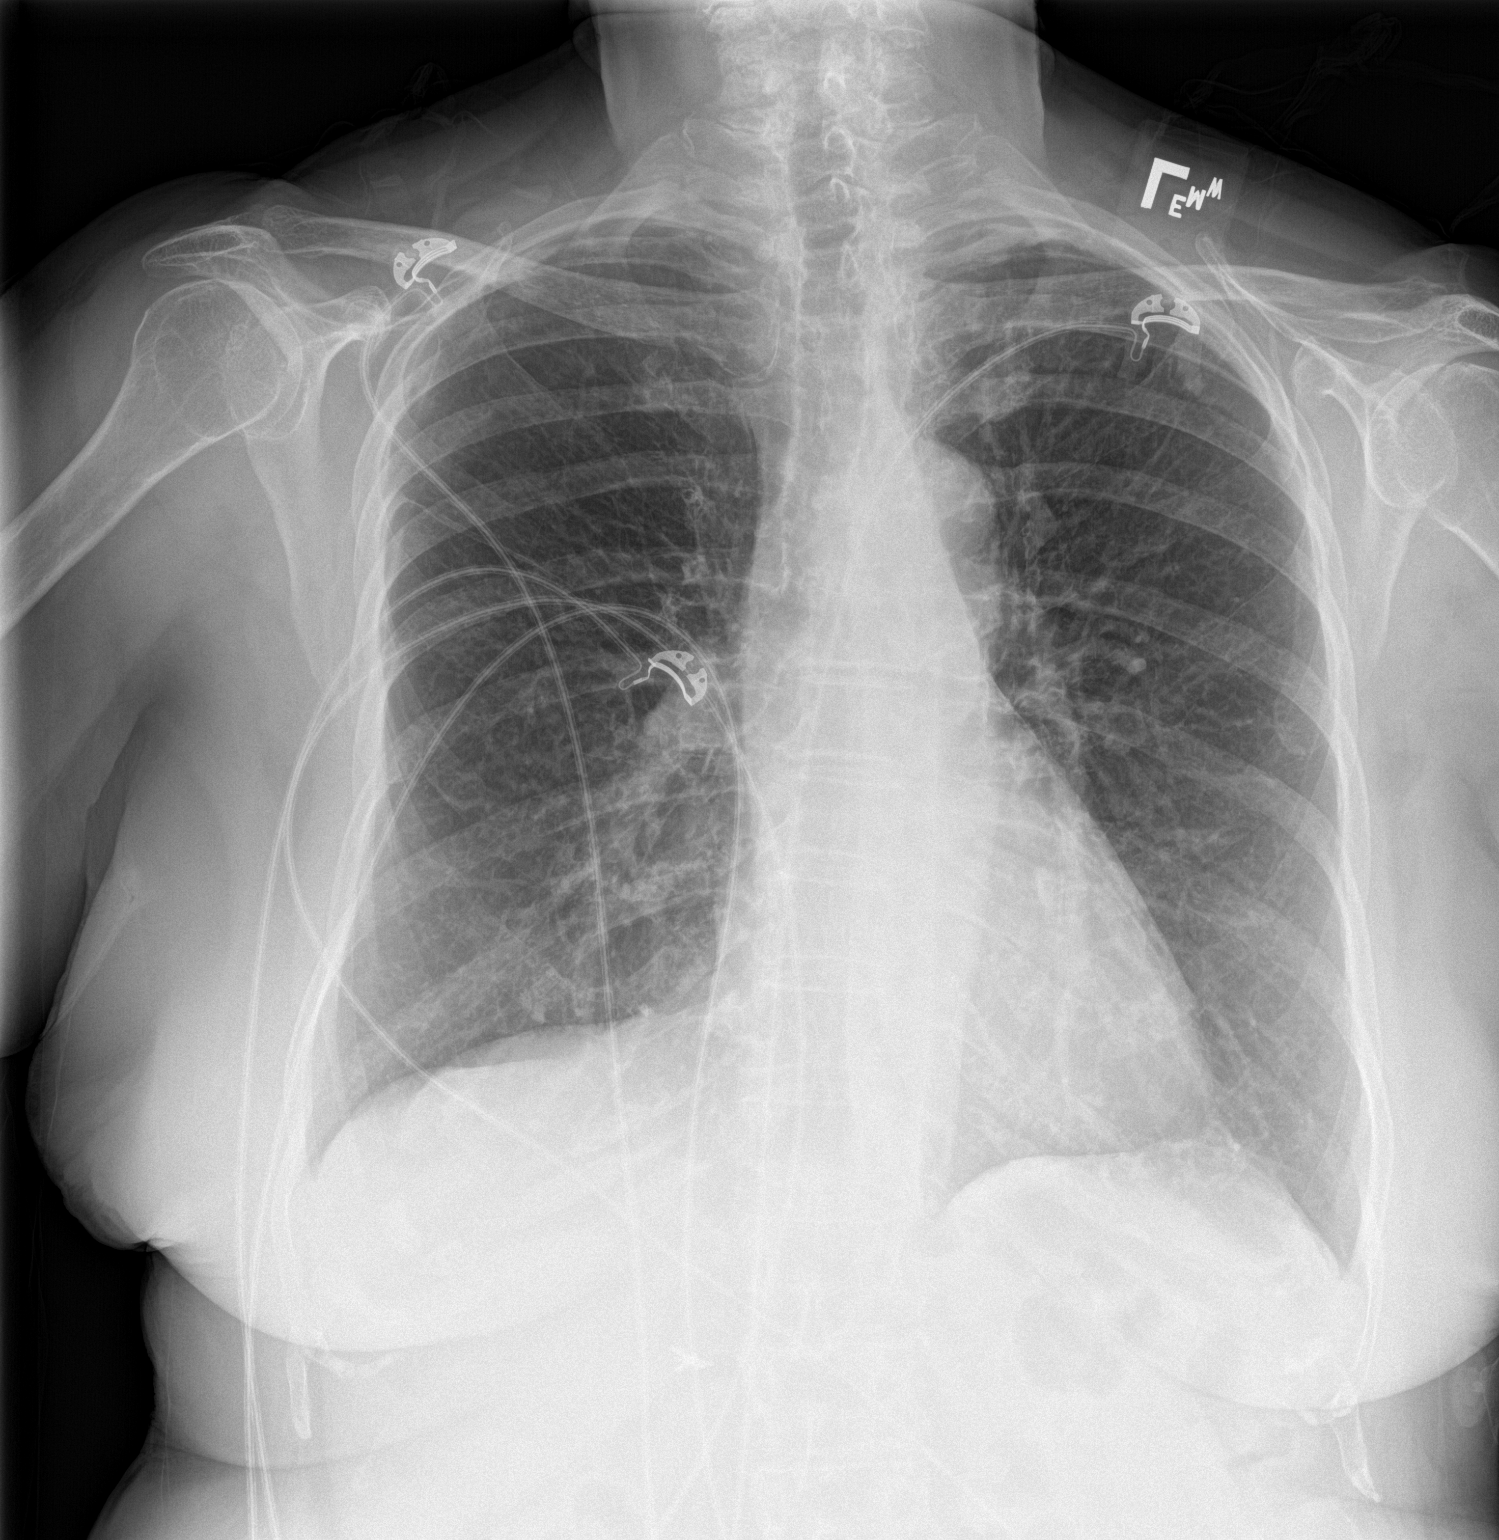

[chest lat]
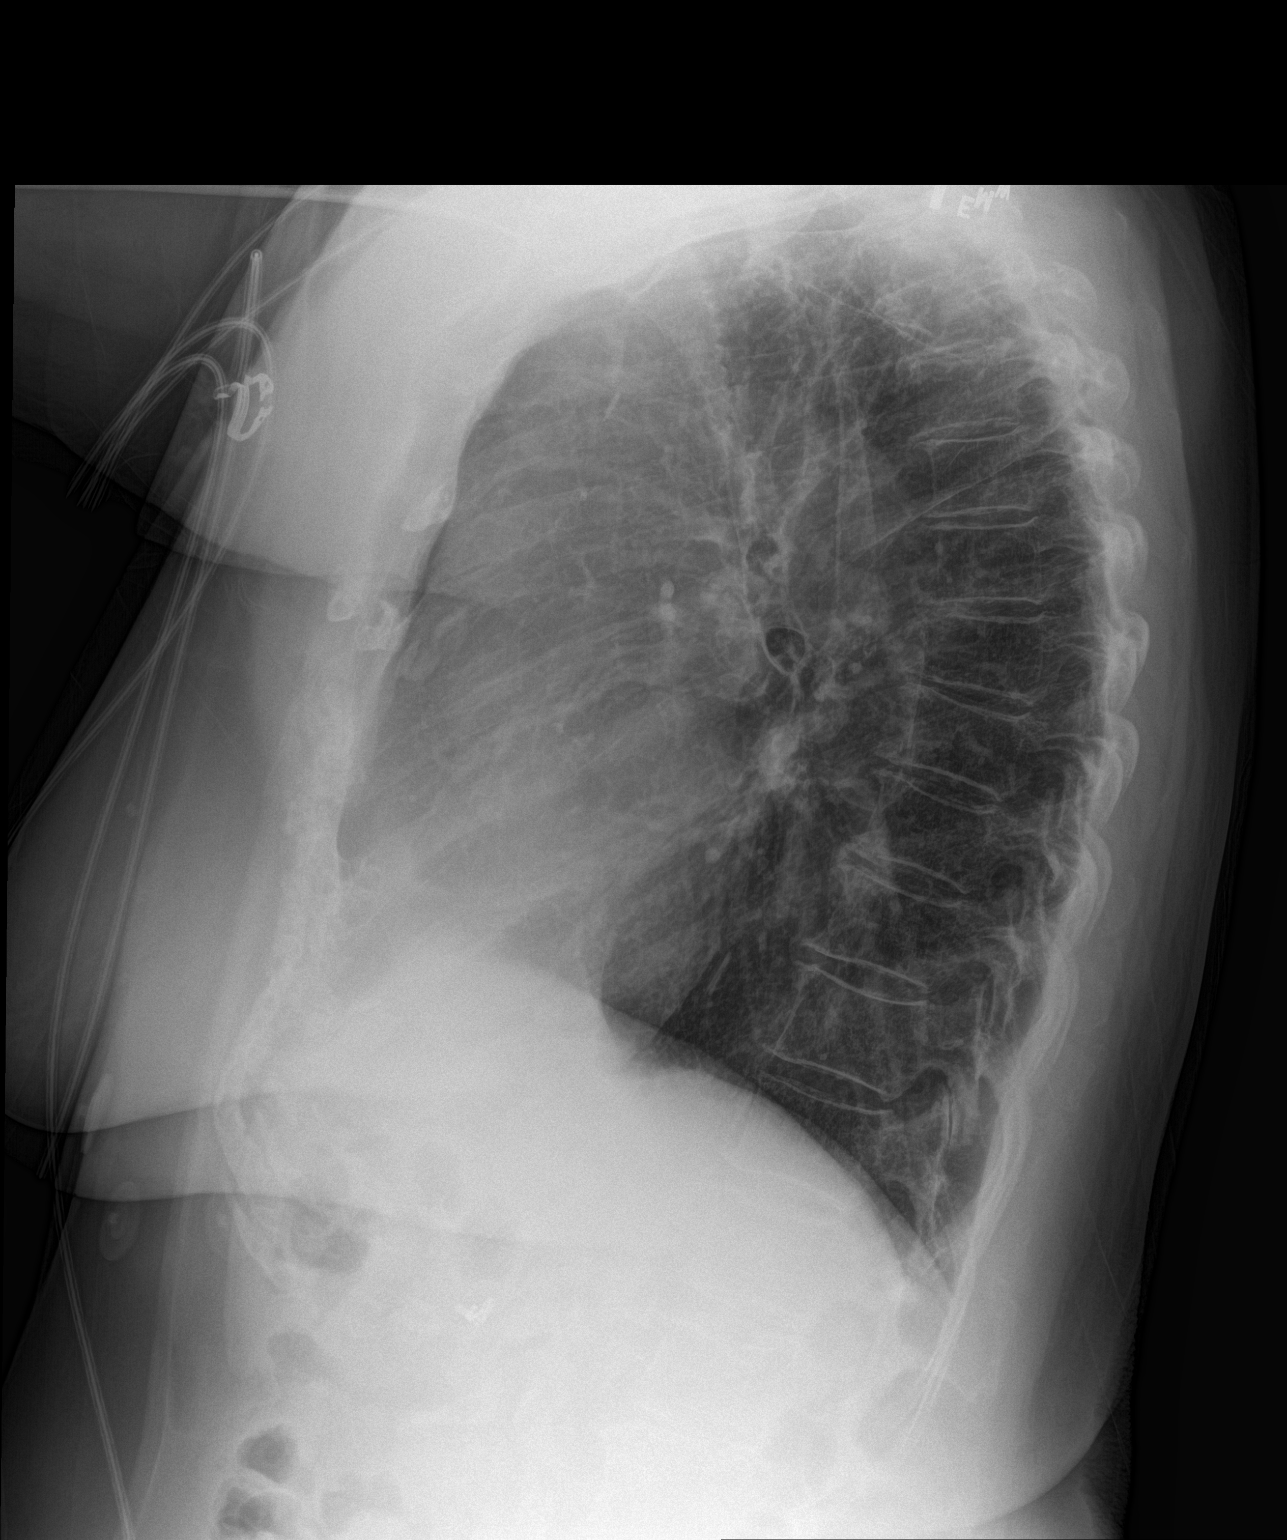

[2 of 2 positions shown; findings below may reference images not displayed]

FINDINGS: The cardiac silhouette, mediastinal and hilar contours are no and
stable. The lungs are clear. No pleural effusion or pulmonary
nodules. The bony thorax is intact.
IMPRESSION: No acute cardiopulmonary findings.

## 2016-12-04 DIAGNOSIS — J9859 Other diseases of mediastinum, not elsewhere classified: Secondary | ICD-10-CM

## 2016-12-04 HISTORY — DX: Other diseases of mediastinum, not elsewhere classified: J98.59

## 2017-04-28 ENCOUNTER — Other Ambulatory Visit: Payer: Self-pay | Admitting: Unknown Physician Specialty

## 2017-04-28 ENCOUNTER — Ambulatory Visit (INDEPENDENT_AMBULATORY_CARE_PROVIDER_SITE_OTHER): Payer: Medicare Other

## 2017-04-28 DIAGNOSIS — R0602 Shortness of breath: Secondary | ICD-10-CM | POA: Diagnosis not present

## 2017-04-28 DIAGNOSIS — R05 Cough: Secondary | ICD-10-CM

## 2017-04-28 DIAGNOSIS — R059 Cough, unspecified: Secondary | ICD-10-CM

## 2018-03-06 HISTORY — PX: BREAST SURGERY: SHX581

## 2018-04-12 ENCOUNTER — Telehealth: Payer: Self-pay

## 2018-04-12 NOTE — Telephone Encounter (Signed)
ERROR  Wrong patient

## 2018-12-15 NOTE — H&P (Signed)
TOTAL HIP REVISION ADMISSION H&P  Patient is admitted for right revision total hip arthroplasty.  Subjective:  Chief Complaint: right hip pain  HPI: Makayla Schultz, 85 y.o. female, has a history of pain and functional disability in the right hip due to failed right total hip arthroplasty and patient has failed non-surgical conservative treatments for greater than 12 weeks to include NSAID's and/or analgesics and activity modification. She has had a previous right total hip in 2003 by Dr. Sherryle Lis. She states she began having pain in the anterior hip and thigh about 1 year ago after rolling out of bed one morning. She was unable to bear weight on the hip after this incident. She resorted to using a walker, and has been using it on/off since then. She was evaluated at [facility], and referred to a surgeon, Dr. Koleen Nimrod, in [facility]. He recommended revision arthroplasty at that time, but her symptoms improved temporarily. This summer, she reached down and had significant pain in the hip, and she presented to Dr. Wynelle Link for evaluation. She has been utilizing a walker, CBD cream, Tylenol, and Tramadol.  There are no active problems to display for this patient.  Past Medical History:  Diagnosis Date  . History of blood clots   . Hyperlipidemia     Past Surgical History:  Procedure Laterality Date  . CHOLECYSTECTOMY    . ELBOW SURGERY    . TOTAL HIP ARTHROPLASTY      No current facility-administered medications for this encounter.    Current Outpatient Medications  Medication Sig Dispense Refill Last Dose  . Biotin 300 MCG TABS Take by mouth.     . clindamycin (CLEOCIN) 300 MG capsule Take 300 mg by mouth 3 (three) times daily. "for dental work"     . levothyroxine (SYNTHROID, LEVOTHROID) 88 MCG tablet Take 88 mcg by mouth daily before breakfast.     . Omega-3 Fatty Acids (FISH OIL) 500 MG CAPS Take by mouth.     . predniSONE (DELTASONE) 20 MG tablet Take 1 tablet (20 mg total) by  mouth 2 (two) times daily. Take with food. 10 tablet 0   . tizanidine (ZANAFLEX) 2 MG capsule Take 2 mg by mouth 3 (three) times daily.     Marland Kitchen warfarin (COUMADIN) 5 MG tablet Take 5 mg by mouth daily.      Allergies  Allergen Reactions  . Penicillins     Social History   Tobacco Use  . Smoking status: Never Smoker  . Smokeless tobacco: Never Used  Substance Use Topics  . Alcohol use: No    Family History  Problem Relation Age of Onset  . Heart disease Mother   . Cancer Father   . Dementia Sister   . Cancer Sister        breast CA  . Diabetes Brother       Review of Systems  Constitutional: Negative for chills and fever.  Respiratory: Negative for cough and shortness of breath.   Cardiovascular: Negative for chest pain and palpitations.  Gastrointestinal: Negative for nausea and vomiting.  Musculoskeletal: Positive for joint pain.    Objective:  Physical Exam Patient is an 85 year old female.  Well nourished and well developed. The patient has a significantly antalgic gait pattern and is ambulating with a walker. She can barely weight-bear on the right hip.  General: Alert and oriented x3, cooperative and pleasant, no acute distress. Head: normocephalic, atraumatic, neck supple. Eyes: EOMI. Respiratory: breath sounds clear in all fields, no wheezing, rales,  or rhonchi. Cardiovascular: Regular rate and rhythm, no murmurs, gallops or rubs. Abdomen: non-tender to palpation and soft, normoactive bowel sounds.  Musculoskeletal: Right Hip Exam: ROM: Flexion to 90, Internal Rotation 20, External Rotation 20, and Abduction 30 with pain. There is no tenderness over the greater trochanter bursa.  Left Hip Exam: ROM: Normal without discomfort. There is no tenderness over the greater trochanter bursa. There is no pain on provocative testing of the hip.  Calves soft and nontender. Motor function intact in LE. Strength 5/5 LE bilaterally. Neuro: Distal pulses 2+.  Sensation to light touch intact in LE.  Vital signs in last 24 hours:  Labs:  Estimated body mass index is 30.73 kg/m as calculated from the following:   Height as of 07/15/15: 5\' 4"  (1.626 m).   Weight as of 07/15/15: 81.2 kg.  Imaging Review:  Plain radiographs demonstrate a prosthesis on the left in good position with no periprosthetic abnormalities. On the right, she has a grossly loose femoral stem which is a cemented stem which has subsided about 1 inch. She has significant endosteal scalloping from the loose cement and has a pedestal distally where the cement restrictor is located. She has a DePuy cemented stem with a Duraloc cup.  Assessment/Plan:   right hip(s) with failed previous arthroplasty.  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 Revision total hip arthroplasty is deemed medically necessary. The treatment options including medical management, injection therapy, arthroscopy and arthroplasty were discussed at length. The risks and benefits of total hip arthroplasty were presented and reviewed. The risks due to aseptic loosening, infection, stiffness, dislocation/subluxation,  thromboembolic complications and other imponderables were discussed.  The patient acknowledged the explanation, agreed to proceed with the plan and consent was signed. Patient is being admitted for inpatient treatment for surgery, pain control, PT, OT, prophylactic antibiotics, VTE prophylaxis, progressive ambulation and ADL's and discharge planning. The patient is planning to be discharged home.   Therapy Plans: HEP Disposition: Home with daughter and son Planned DVT Prophylaxis: Warfarin 5 mg Daily (hx of PE/DVT) DME needed: none PCP: Dr. 09/14/15 TXA: topical Allergies: Lasix - respiratory distress, PCN - itching, adhesive tape - skin  sensitivity Anesthesia Concerns: none BMI: 30.8  Other: History of PE afer hip surgery, and DVTs in the left leg. Hx of breast cancer.  - Patient was instructed on what medications to stop prior to surgery. - Follow-up visit in 2 weeks with Dr. Clearnce Hasten - Begin physical therapy following surgery - Pre-operative lab work as pre-surgical testing - Prescriptions will be provided in hospital at time of discharge  Lequita Halt, PA-C Orthopedic Surgery [facility] [place] [phone]

## 2018-12-24 ENCOUNTER — Encounter (HOSPITAL_COMMUNITY): Payer: Self-pay

## 2018-12-24 NOTE — Patient Instructions (Addendum)
DUE TO COVID-19 ONLY ONE VISITOR IS ALLOWED TO COME WITH YOU AND STAY IN THE WAITING ROOM ONLY DURING PRE OP AND PROCEDURE. THE ONE VISITOR MAY VISIT WITH YOU IN YOUR PRIVATE ROOM DURING VISITING HOURS ONLY!!   COVID SWAB TESTING COMPLETED ON:  Saturday, Nov. 21, 2020  (Must self quarantine after testing. Follow instructions on handout.)         Your procedure is scheduled on: Wednesday, Nov. 25, 2020   Report to [facility] Main  Entrance   Report to Short Stay at 5:30 AM   Call this number if you have problems the morning of surgery [phone]   Do not eat food :After Midnight.   May have liquids until 4:15 AM day of surgery  CLEAR LIQUID DIET  Foods Allowed                                                                     Foods Excluded  Water, Black Coffee and tea, regular and decaf                             liquids that you cannot  Plain Jell-O in any flavor  (No red)                                           see through such as: Fruit ices (not with fruit pulp)                                     milk, soups, orange juice  Iced Popsicles (No red)                                    All solid food Carbonated beverages, regular and diet                                    Apple juices Sports drinks like Gatorade (No red) Lightly seasoned clear broth or consume(fat free) Sugar, honey syrup     Complete one Ensure drink the morning of surgery at 4:15 AM the day of surgery.   Brush your teeth the morning of surgery.   Do NOT smoke after Midnight   Take these medicines the morning of surgery with A SIP OF WATER: Levothyroxine, Pantoprazole   Bring Asthma Inhaler day of surgery                               You may not have any metal on your body including hair pins, jewelry, and body piercings             Do not wear make-up, lotions, powders, perfumes/cologne, or deodorant             Do not wear nail polish.  Do not shave  48 hours  prior to surgery.         Do not bring valuables to the hospital. Laura.   Contacts, dentures or bridgework may not be worn into surgery.   Bring small overnight bag day of surgery.    Patients discharged the day of surgery will not be allowed to drive home.   Special Instructions: Bring a copy of your healthcare power of attorney and living will documents the day of surgery if you haven't scanned them in before.              Please read over the following fact sheets you were given:  [facility] - Preparing for Surgery Before surgery, you can play an important role.  Because skin is not sterile, your skin needs to be as free of germs as possible.  You can reduce the number of germs on your skin by washing with CHG (chlorahexidine gluconate) soap before surgery.  CHG is an antiseptic cleaner which kills germs and bonds with the skin to continue killing germs even after washing. Please DO NOT use if you have an allergy to CHG or antibacterial soaps.  If your skin becomes reddened/irritated stop using the CHG and inform your nurse when you arrive at Short Stay. Do not shave (including legs and underarms) for at least 48 hours prior to the first CHG shower.  You may shave your face/neck.  Please follow these instructions carefully:  1.  Shower with CHG Soap the night before surgery and the  morning of surgery.  2.  If you choose to wash your hair, wash your hair first as usual with your normal  shampoo.  3.  After you shampoo, rinse your hair and body thoroughly to remove the shampoo.                             4.  Use CHG as you would any other liquid soap.  You can apply chg directly to the skin and wash.  Gently with a scrungie or clean washcloth.  5.  Apply the CHG Soap to your body ONLY FROM THE NECK DOWN.   Do   not use on face/ open                           Wound or open sores. Avoid contact with eyes, ears mouth and   genitals (private parts).                        Wash face,  Genitals (private parts) with your normal soap.             6.  Wash thoroughly, paying special attention to the area where your    surgery  will be performed.  7.  Thoroughly rinse your body with warm water from the neck down.  8.  DO NOT shower/wash with your normal soap after using and rinsing off the CHG Soap.                9.  Pat yourself dry with a clean towel.            10.  Wear clean pajamas.            11.  Place clean sheets on your bed the  night of your first shower and do not  sleep with pets. Day of Surgery : Do not apply any lotions/deodorants the morning of surgery.  Please wear clean clothes to the hospital/surgery center.  FAILURE TO FOLLOW THESE INSTRUCTIONS MAY RESULT IN THE CANCELLATION OF YOUR SURGERY  PATIENT SIGNATURE_________________________________  NURSE SIGNATURE__________________________________  ________________________________________________________________________   Rogelia Mire  An incentive spirometer is a tool that can help keep your lungs clear and active. This tool measures how well you are filling your lungs with each breath. Taking long deep breaths may help reverse or decrease the chance of developing breathing (pulmonary) problems (especially infection) following:  A long period of time when you are unable to move or be active. BEFORE THE PROCEDURE   If the spirometer includes an indicator to show your best effort, your nurse or respiratory therapist will set it to a desired goal.  If possible, sit up straight or lean slightly forward. Try not to slouch.  Hold the incentive spirometer in an upright position. INSTRUCTIONS FOR USE  1. Sit on the edge of your bed if possible, or sit up as far as you can in bed or on a chair. 2. Hold the incentive spirometer in an upright position. 3. Breathe out normally. 4. Place the mouthpiece in your mouth and seal your lips tightly around it. 5. Breathe in slowly and  as deeply as possible, raising the piston or the ball toward the top of the column. 6. Hold your breath for 3-5 seconds or for as long as possible. Allow the piston or ball to fall to the bottom of the column. 7. Remove the mouthpiece from your mouth and breathe out normally. 8. Rest for a few seconds and repeat Steps 1 through 7 at least 10 times every 1-2 hours when you are awake. Take your time and take a few normal breaths between deep breaths. 9. The spirometer may include an indicator to show your best effort. Use the indicator as a goal to work toward during each repetition. 10. After each set of 10 deep breaths, practice coughing to be sure your lungs are clear. If you have an incision (the cut made at the time of surgery), support your incision when coughing by placing a pillow or rolled up towels firmly against it. Once you are able to get out of bed, walk around indoors and cough well. You may stop using the incentive spirometer when instructed by your caregiver.  RISKS AND COMPLICATIONS  Take your time so you do not get dizzy or light-headed.  If you are in pain, you may need to take or ask for pain medication before doing incentive spirometry. It is harder to take a deep breath if you are having pain. AFTER USE  Rest and breathe slowly and easily.  It can be helpful to keep track of a log of your progress. Your caregiver can provide you with a simple table to help with this. If you are using the spirometer at home, follow these instructions: SEEK MEDICAL CARE IF:   You are having difficultly using the spirometer.  You have trouble using the spirometer as often as instructed.  Your pain medication is not giving enough relief while using the spirometer.  You develop fever of 100.5 F (38.1 C) or higher. SEEK IMMEDIATE MEDICAL CARE IF:   You cough up bloody sputum that had not been present before.  You develop fever of 102 F (38.9 C) or greater.  You develop worsening  pain at  or near the incision site. MAKE SURE YOU:   Understand these instructions.  Will watch your condition.  Will get help right away if you are not doing well or get worse. Document Released: 06/02/2006 Document Revised: 04/14/2011 Document Reviewed: 08/03/2006 ExitCare Patient Information 2014 ExitCare, [place].   ________________________________________________________________________  WHAT IS A BLOOD TRANSFUSION? Blood Transfusion Information  A transfusion is the replacement of blood or some of its parts. Blood is made up of multiple cells which provide different functions.  Red blood cells carry oxygen and are used for blood loss replacement.  White blood cells fight against infection.  Platelets control bleeding.  Plasma helps clot blood.  Other blood products are available for specialized needs, such as hemophilia or other clotting disorders. BEFORE THE TRANSFUSION  Who gives blood for transfusions?   Healthy volunteers who are fully evaluated to make sure their blood is safe. This is blood bank blood. Transfusion therapy is the safest it has ever been in the practice of medicine. Before blood is taken from a donor, a complete history is taken to make sure that person has no history of diseases nor engages in risky social behavior (examples are intravenous drug use or sexual activity with multiple partners). The donor's travel history is screened to minimize risk of transmitting infections, such as malaria. The donated blood is tested for signs of infectious diseases, such as HIV and hepatitis. The blood is then tested to be sure it is compatible with you in order to minimize the chance of a transfusion reaction. If you or a relative donates blood, this is often done in anticipation of surgery and is not appropriate for emergency situations. It takes many days to process the donated blood. RISKS AND COMPLICATIONS Although transfusion therapy is very safe and saves many  lives, the main dangers of transfusion include:   Getting an infectious disease.  Developing a transfusion reaction. This is an allergic reaction to something in the blood you were given. Every precaution is taken to prevent this. The decision to have a blood transfusion has been considered carefully by your caregiver before blood is given. Blood is not given unless the benefits outweigh the risks. AFTER THE TRANSFUSION  Right after receiving a blood transfusion, you will usually feel much better and more energetic. This is especially true if your red blood cells have gotten low (anemic). The transfusion raises the level of the red blood cells which carry oxygen, and this usually causes an energy increase.  The nurse administering the transfusion will monitor you carefully for complications. HOME CARE INSTRUCTIONS  No special instructions are needed after a transfusion. You may find your energy is better. Speak with your caregiver about any limitations on activity for underlying diseases you may have. SEEK MEDICAL CARE IF:   Your condition is not improving after your transfusion.  You develop redness or irritation at the intravenous (IV) site. SEEK IMMEDIATE MEDICAL CARE IF:  Any of the following symptoms occur over the next 12 hours:  Shaking chills.  You have a temperature by mouth above 102 F (38.9 C), not controlled by medicine.  Chest, back, or muscle pain.  People around you feel you are not acting correctly or are confused.  Shortness of breath or difficulty breathing.  Dizziness and fainting.  You get a rash or develop hives.  You have a decrease in urine output.  Your urine turns a dark color or changes to pink, red, or brown. Any of the following symptoms occur over  the next 10 days:  You have a temperature by mouth above 102 F (38.9 C), not controlled by medicine.  Shortness of breath.  Weakness after normal activity.  The white part of the eye turns  yellow (jaundice).  You have a decrease in the amount of urine or are urinating less often.  Your urine turns a dark color or changes to pink, red, or brown. Document Released: 01/18/2000 Document Revised: 04/14/2011 Document Reviewed: 09/06/2007 [facility] Patient Information 2014 [place], [place].  _______________________________________________________________________

## 2018-12-25 ENCOUNTER — Other Ambulatory Visit (HOSPITAL_COMMUNITY)
Admission: RE | Admit: 2018-12-25 | Discharge: 2018-12-25 | Disposition: A | Discharge status: Home / Self Care | Payer: Medicare Other | Source: Ambulatory Visit | Attending: Orthopedic Surgery | Admitting: Orthopedic Surgery

## 2018-12-25 DIAGNOSIS — Z20828 Contact with and (suspected) exposure to other viral communicable diseases: Secondary | ICD-10-CM | POA: Insufficient documentation

## 2018-12-25 DIAGNOSIS — Z01812 Encounter for preprocedural laboratory examination: Secondary | ICD-10-CM | POA: Insufficient documentation

## 2018-12-27 ENCOUNTER — Encounter (HOSPITAL_COMMUNITY)
Admission: RE | Admit: 2018-12-27 | Discharge: 2018-12-27 | Disposition: A | Discharge status: Home / Self Care | Payer: Medicare Other | Source: Ambulatory Visit | Attending: Orthopedic Surgery | Admitting: Orthopedic Surgery

## 2018-12-27 ENCOUNTER — Other Ambulatory Visit: Payer: Self-pay

## 2018-12-27 ENCOUNTER — Encounter (HOSPITAL_COMMUNITY): Payer: Self-pay

## 2018-12-27 DIAGNOSIS — T84090A Other mechanical complication of internal right hip prosthesis, initial encounter: Secondary | ICD-10-CM | POA: Insufficient documentation

## 2018-12-27 DIAGNOSIS — Z01818 Encounter for other preprocedural examination: Secondary | ICD-10-CM | POA: Insufficient documentation

## 2018-12-27 DIAGNOSIS — X58XXXA Exposure to other specified factors, initial encounter: Secondary | ICD-10-CM | POA: Insufficient documentation

## 2018-12-27 DIAGNOSIS — I10 Essential (primary) hypertension: Secondary | ICD-10-CM | POA: Insufficient documentation

## 2018-12-27 HISTORY — DX: Dyspnea, unspecified: R06.00

## 2018-12-27 HISTORY — DX: Ventricular premature depolarization: I49.3

## 2018-12-27 HISTORY — DX: Polyneuropathy, unspecified: G62.9

## 2018-12-27 HISTORY — DX: Non-pressure chronic ulcer of left ankle with unspecified severity: L97.329

## 2018-12-27 HISTORY — DX: Personal history of colon polyps, unspecified: Z86.0100

## 2018-12-27 HISTORY — DX: Postthrombotic syndrome without complications of unspecified extremity: I87.009

## 2018-12-27 HISTORY — DX: Personal history of colonic polyps: Z86.010

## 2018-12-27 HISTORY — DX: Pulmonary hypertension, unspecified: I27.20

## 2018-12-27 HISTORY — DX: Unspecified asthma, uncomplicated: J45.909

## 2018-12-27 HISTORY — DX: Anemia, unspecified: D64.9

## 2018-12-27 HISTORY — DX: Other fatigue: R53.83

## 2018-12-27 HISTORY — DX: Unspecified osteoarthritis, unspecified site: M19.90

## 2018-12-27 HISTORY — DX: Venous insufficiency (chronic) (peripheral): I87.2

## 2018-12-27 HISTORY — DX: Antiphospholipid syndrome: D68.61

## 2018-12-27 HISTORY — DX: Varicose veins of left lower extremity with ulcer of ankle: I83.023

## 2018-12-27 HISTORY — DX: Cardiac arrhythmia, unspecified: I49.9

## 2018-12-27 LAB — COMPREHENSIVE METABOLIC PANEL
ALT: 13 U/L (ref 0–44)
AST: 19 U/L (ref 15–41)
Albumin: 4 g/dL (ref 3.5–5.0)
Alkaline Phosphatase: 94 U/L (ref 38–126)
Anion gap: 10 (ref 5–15)
BUN: 18 mg/dL (ref 8–23)
CO2: 26 mmol/L (ref 22–32)
Calcium: 9.1 mg/dL (ref 8.9–10.3)
Chloride: 102 mmol/L (ref 98–111)
Creatinine, Ser: 0.6 mg/dL (ref 0.44–1.00)
GFR calc Af Amer: >60 mL/min (ref >60)
GFR calc non Af Amer: >60 mL/min (ref >60)
Glucose, Bld: 92 mg/dL (ref 70–99)
Potassium: 4 mmol/L (ref 3.5–5.1)
Sodium: 138 mmol/L (ref 135–145)
Total Bilirubin: 0.4 mg/dL (ref 0.3–1.2)
Total Protein: 7.1 g/dL (ref 6.5–8.1)

## 2018-12-27 LAB — SURGICAL PCR SCREEN
MRSA, PCR: NEGATIVE
Staphylococcus aureus: NEGATIVE

## 2018-12-27 LAB — CBC WITH DIFFERENTIAL/PLATELET
Abs Immature Granulocytes: 0.01 10*3/uL (ref 0.00–0.07)
Basophils Absolute: 0 10*3/uL (ref 0.0–0.1)
Basophils Relative: 1 %
Eosinophils Absolute: 0.1 10*3/uL (ref 0.0–0.5)
Eosinophils Relative: 2 %
HCT: 39.7 % (ref 36.0–46.0)
Hemoglobin: 12.1 g/dL (ref 12.0–15.0)
Immature Granulocytes: 0 %
Lymphocytes Relative: 21 %
Lymphs Abs: 1.3 10*3/uL (ref 0.7–4.0)
MCH: 28 pg (ref 26.0–34.0)
MCHC: 30.5 g/dL (ref 30.0–36.0)
MCV: 91.9 fL (ref 80.0–100.0)
Monocytes Absolute: 0.7 10*3/uL (ref 0.1–1.0)
Monocytes Relative: 12 %
Neutro Abs: 3.9 10*3/uL (ref 1.7–7.7)
Neutrophils Relative %: 64 %
Platelets: 276 10*3/uL (ref 150–400)
RBC: 4.32 MIL/uL (ref 3.87–5.11)
RDW: 14.2 % (ref 11.5–15.5)
WBC: 6.1 10*3/uL (ref 4.0–10.5)
nRBC: 0 % (ref 0.0–0.2)

## 2018-12-27 LAB — PROTIME-INR
INR: 1.8 — ABNORMAL HIGH (ref 0.8–1.2)
Prothrombin Time: 21.1 seconds — ABNORMAL HIGH (ref 11.4–15.2)

## 2018-12-27 LAB — APTT: aPTT: 36 seconds (ref 24–36)

## 2018-12-27 LAB — NOVEL CORONAVIRUS, NAA (HOSP ORDER, SEND-OUT TO REF LAB; TAT 18-24 HRS): SARS-CoV-2, NAA: NOT DETECTED

## 2018-12-27 LAB — ABO/RH: ABO/RH(D): A POS

## 2018-12-27 NOTE — Progress Notes (Signed)
PCP - Dr. Harlen Labs Cardiologist - no  Chest x-ray - 04/28/17 EKG - 12/27/18 Stress Test - no ECHO - no Cardiac Cath - no  Sleep Study - no CPAP -   Fasting Blood Sugar - no Checks Blood Sugar _____ times a day  Blood Thinner Instructions:Warfarin Aspirin Instructions:stop 5 days prior to DOS Last Dose:12/25/18  Anesthesia review:   Patient denies shortness of breath, fever, cough and chest pain at PAT appointment yes  Patient verbalized understanding of instructions that were given to them at the PAT appointment. Patient was also instructed that they will need to review over the PAT instructions again at home before surgery. yes

## 2018-12-28 MED ORDER — TRANEXAMIC ACID 1000 MG/10ML IV SOLN
2000.0000 mg | INTRAVENOUS | Status: AC
  Filled 2018-12-28: qty 20

## 2018-12-28 NOTE — Anesthesia Preprocedure Evaluation (Signed)
Anesthesia Evaluation  Patient identified by MRN, date of birth, ID band Patient awake    Reviewed: Allergy & Precautions, NPO status , Patient's Chart, lab work & pertinent test results  Airway Mallampati: II  TM Distance: >3 FB Neck ROM: Full    Dental no notable dental hx.    Pulmonary shortness of breath, asthma ,  Inhalers: symbicort, albuterol   Pulmonary exam normal breath sounds clear to auscultation       Cardiovascular + DVT  Normal cardiovascular exam Rhythm:Regular Rate:Normal  Hx DVT postop after last hip surgery 2 years ago  Does not have cardiologist, has never had echo or stress test  ? Dysrhythmia and pulm HTN listed in problem list-   Neuro/Psych negative neurological ROS  negative psych ROS   GI/Hepatic negative GI ROS, Neg liver ROS,   Endo/Other  Hypothyroidism Mediastinal mass s/p thymoma 2018  Renal/GU negative Renal ROS  negative genitourinary   Musculoskeletal  (+) Arthritis , Osteoarthritis,  Failed right total hip arthroplasty   Abdominal   Peds negative pediatric ROS (+)  Hematology  (+) anemia , Antiphospholipid syndrome- on coumadin   Anesthesia Other Findings Pain meds: tramadol  Reproductive/Obstetrics negative OB ROS                             Anesthesia Physical Anesthesia Plan  ASA: III  Anesthesia Plan: Spinal   Post-op Pain Management:    Induction:   PONV Risk Score and Plan: 2 and Propofol infusion, TIVA and Treatment may vary due to age or medical condition  Airway Management Planned: Natural Airway and Nasal Cannula  Additional Equipment: None  Intra-op Plan:   Post-operative Plan:   Informed Consent: I have reviewed the patients History and Physical, chart, labs and discussed the procedure including the risks, benefits and alternatives for the proposed anesthesia with the patient or authorized representative who has indicated  his/her understanding and acceptance.       Plan Discussed with: CRNA  Anesthesia Plan Comments:         Anesthesia Quick Evaluation

## 2018-12-29 ENCOUNTER — Inpatient Hospital Stay (HOSPITAL_COMMUNITY): Payer: Medicare Other

## 2018-12-29 ENCOUNTER — Encounter (HOSPITAL_COMMUNITY): Payer: Self-pay

## 2018-12-29 ENCOUNTER — Encounter (HOSPITAL_COMMUNITY)
Admission: RE | Disposition: A | Discharge status: Home Health | Payer: Self-pay | Source: Home / Self Care | Attending: Orthopedic Surgery

## 2018-12-29 ENCOUNTER — Other Ambulatory Visit: Payer: Self-pay

## 2018-12-29 ENCOUNTER — Inpatient Hospital Stay (HOSPITAL_COMMUNITY)
Admission: RE | Admit: 2018-12-29 | Discharge: 2019-01-01 | DRG: 467 | Disposition: A | Discharge status: Home Health | Payer: Medicare Other | Attending: Orthopedic Surgery | Admitting: Orthopedic Surgery

## 2018-12-29 ENCOUNTER — Inpatient Hospital Stay (HOSPITAL_COMMUNITY): Payer: Medicare Other | Admitting: Anesthesiology

## 2018-12-29 ENCOUNTER — Inpatient Hospital Stay (HOSPITAL_COMMUNITY): Payer: Medicare Other | Admitting: Physician Assistant

## 2018-12-29 DIAGNOSIS — Z9049 Acquired absence of other specified parts of digestive tract: Secondary | ICD-10-CM | POA: Diagnosis not present

## 2018-12-29 DIAGNOSIS — Y792 Prosthetic and other implants, materials and accessory orthopedic devices associated with adverse incidents: Secondary | ICD-10-CM | POA: Diagnosis present

## 2018-12-29 DIAGNOSIS — Z20828 Contact with and (suspected) exposure to other viral communicable diseases: Secondary | ICD-10-CM | POA: Diagnosis present

## 2018-12-29 DIAGNOSIS — Z888 Allergy status to other drugs, medicaments and biological substances status: Secondary | ICD-10-CM | POA: Diagnosis not present

## 2018-12-29 DIAGNOSIS — Z86010 Personal history of colonic polyps: Secondary | ICD-10-CM

## 2018-12-29 DIAGNOSIS — Z7901 Long term (current) use of anticoagulants: Secondary | ICD-10-CM

## 2018-12-29 DIAGNOSIS — T84030A Mechanical loosening of internal right hip prosthetic joint, initial encounter: Principal | ICD-10-CM | POA: Diagnosis present

## 2018-12-29 DIAGNOSIS — G629 Polyneuropathy, unspecified: Secondary | ICD-10-CM | POA: Diagnosis present

## 2018-12-29 DIAGNOSIS — I272 Pulmonary hypertension, unspecified: Secondary | ICD-10-CM | POA: Diagnosis present

## 2018-12-29 DIAGNOSIS — Z853 Personal history of malignant neoplasm of breast: Secondary | ICD-10-CM | POA: Diagnosis not present

## 2018-12-29 DIAGNOSIS — Z880 Allergy status to penicillin: Secondary | ICD-10-CM | POA: Diagnosis not present

## 2018-12-29 DIAGNOSIS — D6861 Antiphospholipid syndrome: Secondary | ICD-10-CM | POA: Diagnosis present

## 2018-12-29 DIAGNOSIS — Z419 Encounter for procedure for purposes other than remedying health state, unspecified: Secondary | ICD-10-CM

## 2018-12-29 DIAGNOSIS — Z7952 Long term (current) use of systemic steroids: Secondary | ICD-10-CM

## 2018-12-29 DIAGNOSIS — Z8249 Family history of ischemic heart disease and other diseases of the circulatory system: Secondary | ICD-10-CM

## 2018-12-29 DIAGNOSIS — Z833 Family history of diabetes mellitus: Secondary | ICD-10-CM

## 2018-12-29 DIAGNOSIS — T84018A Broken internal joint prosthesis, other site, initial encounter: Secondary | ICD-10-CM

## 2018-12-29 DIAGNOSIS — Z86711 Personal history of pulmonary embolism: Secondary | ICD-10-CM

## 2018-12-29 DIAGNOSIS — Z86718 Personal history of other venous thrombosis and embolism: Secondary | ICD-10-CM | POA: Diagnosis not present

## 2018-12-29 DIAGNOSIS — Z79890 Hormone replacement therapy (postmenopausal): Secondary | ICD-10-CM | POA: Diagnosis not present

## 2018-12-29 DIAGNOSIS — E785 Hyperlipidemia, unspecified: Secondary | ICD-10-CM | POA: Diagnosis present

## 2018-12-29 DIAGNOSIS — Z96649 Presence of unspecified artificial hip joint: Secondary | ICD-10-CM

## 2018-12-29 DIAGNOSIS — Z803 Family history of malignant neoplasm of breast: Secondary | ICD-10-CM

## 2018-12-29 DIAGNOSIS — M1990 Unspecified osteoarthritis, unspecified site: Secondary | ICD-10-CM | POA: Diagnosis present

## 2018-12-29 DIAGNOSIS — J45909 Unspecified asthma, uncomplicated: Secondary | ICD-10-CM | POA: Diagnosis present

## 2018-12-29 DIAGNOSIS — Z79899 Other long term (current) drug therapy: Secondary | ICD-10-CM

## 2018-12-29 HISTORY — PX: TOTAL HIP REVISION: SHX763

## 2018-12-29 LAB — PROTIME-INR
INR: 1.2 (ref 0.8–1.2)
Prothrombin Time: 15.4 seconds — ABNORMAL HIGH (ref 11.4–15.2)

## 2018-12-29 LAB — PREPARE RBC (CROSSMATCH)

## 2018-12-29 SURGERY — TOTAL HIP REVISION
Anesthesia: Spinal | Site: Hip | Laterality: Right

## 2018-12-29 MED ORDER — LETROZOLE 2.5 MG PO TABS
2.5000 mg | ORAL_TABLET | Freq: Every day | ORAL | Status: DC
  Administered 2018-12-29 – 2019-01-01 (×3): 2.5 mg via ORAL
  Filled 2018-12-29 (×5): qty 1

## 2018-12-29 MED ORDER — CHLORHEXIDINE GLUCONATE 4 % EX LIQD: 60.0000 mL | Freq: Once | CUTANEOUS | Status: DC

## 2018-12-29 MED ORDER — SODIUM CHLORIDE 0.9 % IR SOLN
Status: DC | PRN
  Administered 2018-12-29: 1000 mL

## 2018-12-29 MED ORDER — BUPIVACAINE HCL (PF) 0.5 % IJ SOLN
INTRAMUSCULAR | Status: DC | PRN
  Administered 2018-12-29: 3 mL via INTRATHECAL

## 2018-12-29 MED ORDER — BUPIVACAINE HCL (PF) 0.25 % IJ SOLN
INTRAMUSCULAR | Status: AC
  Filled 2018-12-29: qty 30

## 2018-12-29 MED ORDER — PANTOPRAZOLE SODIUM 40 MG PO TBEC
40.0000 mg | DELAYED_RELEASE_TABLET | Freq: Every day | ORAL | Status: DC
  Administered 2018-12-29 – 2019-01-01 (×4): 40 mg via ORAL
  Filled 2018-12-29 (×4): qty 1

## 2018-12-29 MED ORDER — ALBUMIN HUMAN 5 % IV SOLN
INTRAVENOUS | Status: AC
  Filled 2018-12-29: qty 250

## 2018-12-29 MED ORDER — METOCLOPRAMIDE HCL 5 MG PO TABS: 5.0000 mg | ORAL_TABLET | Freq: Three times a day (TID) | ORAL | Status: DC | PRN

## 2018-12-29 MED ORDER — PROPOFOL 500 MG/50ML IV EMUL
INTRAVENOUS | Status: DC | PRN
  Administered 2018-12-29: 35 ug/kg/min via INTRAVENOUS

## 2018-12-29 MED ORDER — ONDANSETRON HCL 4 MG/2ML IJ SOLN: 4.0000 mg | Freq: Four times a day (QID) | INTRAMUSCULAR | Status: DC | PRN

## 2018-12-29 MED ORDER — FENTANYL CITRATE (PF) 100 MCG/2ML IJ SOLN
25.0000 ug | INTRAMUSCULAR | Status: DC | PRN
  Administered 2018-12-29: 50 ug via INTRAVENOUS

## 2018-12-29 MED ORDER — PROPOFOL 10 MG/ML IV BOLUS
INTRAVENOUS | Status: AC
  Filled 2018-12-29: qty 20

## 2018-12-29 MED ORDER — STERILE WATER FOR IRRIGATION IR SOLN
Status: DC | PRN
  Administered 2018-12-29: 2000 mL

## 2018-12-29 MED ORDER — DEXAMETHASONE SODIUM PHOSPHATE 10 MG/ML IJ SOLN
INTRAMUSCULAR | Status: AC
  Filled 2018-12-29: qty 2

## 2018-12-29 MED ORDER — TRANEXAMIC ACID 1000 MG/10ML IV SOLN
INTRAVENOUS | Status: DC | PRN
  Administered 2018-12-29: 2000 mg via TOPICAL

## 2018-12-29 MED ORDER — FENTANYL CITRATE (PF) 100 MCG/2ML IJ SOLN
INTRAMUSCULAR | Status: DC | PRN
  Administered 2018-12-29 (×3): 25 ug via INTRAVENOUS

## 2018-12-29 MED ORDER — LOPERAMIDE HCL 2 MG PO CAPS: 2.0000 mg | ORAL_CAPSULE | Freq: Every evening | ORAL | Status: DC | PRN

## 2018-12-29 MED ORDER — ALBUMIN HUMAN 5 % IV SOLN
INTRAVENOUS | Status: DC | PRN
  Administered 2018-12-29: 09:00:00 via INTRAVENOUS

## 2018-12-29 MED ORDER — ONDANSETRON HCL 4 MG/2ML IJ SOLN
INTRAMUSCULAR | Status: AC
  Filled 2018-12-29: qty 4

## 2018-12-29 MED ORDER — DIPHENHYDRAMINE HCL 12.5 MG/5ML PO ELIX: 12.5000 mg | ORAL_SOLUTION | ORAL | Status: DC | PRN

## 2018-12-29 MED ORDER — ESMOLOL HCL 100 MG/10ML IV SOLN
INTRAVENOUS | Status: AC
  Filled 2018-12-29: qty 10

## 2018-12-29 MED ORDER — ACETAMINOPHEN 10 MG/ML IV SOLN
1000.0000 mg | Freq: Four times a day (QID) | INTRAVENOUS | Status: DC
  Administered 2018-12-29: 1000 mg via INTRAVENOUS
  Filled 2018-12-29 (×2): qty 100

## 2018-12-29 MED ORDER — MOMETASONE FURO-FORMOTEROL FUM 200-5 MCG/ACT IN AERO: 2.0000 | INHALATION_SPRAY | Freq: Two times a day (BID) | RESPIRATORY_TRACT | Status: DC | PRN

## 2018-12-29 MED ORDER — ONDANSETRON HCL 4 MG PO TABS: 4.0000 mg | ORAL_TABLET | Freq: Four times a day (QID) | ORAL | Status: DC | PRN

## 2018-12-29 MED ORDER — KETOROLAC TROMETHAMINE 15 MG/ML IJ SOLN: 15.0000 mg | Freq: Once | INTRAMUSCULAR | Status: DC | PRN

## 2018-12-29 MED ORDER — ENOXAPARIN SODIUM 40 MG/0.4ML ~~LOC~~ SOLN: 40.0000 mg | SUBCUTANEOUS | 0 refills | Status: DC

## 2018-12-29 MED ORDER — PROPOFOL 500 MG/50ML IV EMUL
INTRAVENOUS | Status: AC
  Filled 2018-12-29: qty 50

## 2018-12-29 MED ORDER — CEFAZOLIN SODIUM-DEXTROSE 2-4 GM/100ML-% IV SOLN
2.0000 g | Freq: Four times a day (QID) | INTRAVENOUS | Status: AC
  Administered 2018-12-29 (×2): 2 g via INTRAVENOUS
  Filled 2018-12-29 (×2): qty 100

## 2018-12-29 MED ORDER — DEXAMETHASONE SODIUM PHOSPHATE 10 MG/ML IJ SOLN
INTRAMUSCULAR | Status: DC | PRN
  Administered 2018-12-29: 4 mg via INTRAVENOUS

## 2018-12-29 MED ORDER — BUPIVACAINE HCL 0.25 % IJ SOLN
INTRAMUSCULAR | Status: DC | PRN
  Administered 2018-12-29: 30 mL

## 2018-12-29 MED ORDER — LACTATED RINGERS IV SOLN
INTRAVENOUS | Status: DC
  Administered 2018-12-29 (×2): via INTRAVENOUS

## 2018-12-29 MED ORDER — SODIUM CHLORIDE 0.9 % IV SOLN
INTRAVENOUS | Status: DC
  Administered 2018-12-29: 13:00:00 via INTRAVENOUS

## 2018-12-29 MED ORDER — ACETAMINOPHEN 500 MG PO TABS: 1000.0000 mg | ORAL_TABLET | Freq: Once | ORAL | Status: DC

## 2018-12-29 MED ORDER — ONDANSETRON HCL 4 MG/2ML IJ SOLN
INTRAMUSCULAR | Status: DC | PRN
  Administered 2018-12-29: 4 mg via INTRAVENOUS

## 2018-12-29 MED ORDER — POVIDONE-IODINE 10 % EX SWAB: 2.0000 "application " | Freq: Once | CUTANEOUS | Status: DC

## 2018-12-29 MED ORDER — DEXAMETHASONE SODIUM PHOSPHATE 10 MG/ML IJ SOLN: 8.0000 mg | Freq: Once | INTRAMUSCULAR | Status: AC

## 2018-12-29 MED ORDER — METHOCARBAMOL 500 MG PO TABS
500.0000 mg | ORAL_TABLET | Freq: Four times a day (QID) | ORAL | Status: DC | PRN
  Administered 2018-12-29 – 2018-12-31 (×4): 500 mg via ORAL
  Filled 2018-12-29 (×4): qty 1

## 2018-12-29 MED ORDER — ESMOLOL HCL 100 MG/10ML IV SOLN
INTRAVENOUS | Status: DC | PRN
  Administered 2018-12-29 (×3): 10 mg via INTRAVENOUS

## 2018-12-29 MED ORDER — ONDANSETRON HCL 4 MG/2ML IJ SOLN: 4.0000 mg | Freq: Once | INTRAMUSCULAR | Status: DC | PRN

## 2018-12-29 MED ORDER — WARFARIN - PHARMACIST DOSING INPATIENT: Freq: Every day | Status: DC

## 2018-12-29 MED ORDER — METHOCARBAMOL 500 MG PO TABS: 500.0000 mg | ORAL_TABLET | Freq: Four times a day (QID) | ORAL | 0 refills | Status: AC | PRN

## 2018-12-29 MED ORDER — FLEET ENEMA 7-19 GM/118ML RE ENEM: 1.0000 | ENEMA | Freq: Once | RECTAL | Status: DC | PRN

## 2018-12-29 MED ORDER — PHENOL 1.4 % MT LIQD
1.0000 | OROMUCOSAL | Status: DC | PRN
  Filled 2018-12-29: qty 177

## 2018-12-29 MED ORDER — ENOXAPARIN SODIUM 40 MG/0.4ML ~~LOC~~ SOLN
40.0000 mg | SUBCUTANEOUS | Status: DC
  Administered 2018-12-30 – 2019-01-01 (×3): 40 mg via SUBCUTANEOUS
  Filled 2018-12-29 (×3): qty 0.4

## 2018-12-29 MED ORDER — METHOCARBAMOL 500 MG IVPB - SIMPLE MED
500.0000 mg | Freq: Four times a day (QID) | INTRAVENOUS | Status: DC | PRN
  Filled 2018-12-29: qty 50

## 2018-12-29 MED ORDER — CEFAZOLIN SODIUM-DEXTROSE 2-4 GM/100ML-% IV SOLN
2.0000 g | INTRAVENOUS | Status: AC
  Administered 2018-12-29: 07:00:00 2 g via INTRAVENOUS
  Filled 2018-12-29: qty 100

## 2018-12-29 MED ORDER — ALBUTEROL SULFATE 1.25 MG/3ML IN NEBU: 1.0000 | INHALATION_SOLUTION | Freq: Four times a day (QID) | RESPIRATORY_TRACT | Status: DC | PRN

## 2018-12-29 MED ORDER — PROPOFOL 10 MG/ML IV BOLUS
INTRAVENOUS | Status: DC | PRN
  Administered 2018-12-29 (×2): 10 mg via INTRAVENOUS

## 2018-12-29 MED ORDER — POLYETHYLENE GLYCOL 3350 17 G PO PACK: 17.0000 g | PACK | Freq: Every day | ORAL | Status: DC | PRN

## 2018-12-29 MED ORDER — TRAMADOL HCL 50 MG PO TABS
50.0000 mg | ORAL_TABLET | Freq: Four times a day (QID) | ORAL | Status: DC | PRN
  Filled 2018-12-29: qty 1

## 2018-12-29 MED ORDER — FENTANYL CITRATE (PF) 100 MCG/2ML IJ SOLN
INTRAMUSCULAR | Status: AC
  Filled 2018-12-29: qty 2

## 2018-12-29 MED ORDER — PHENYLEPHRINE HCL-NACL 10-0.9 MG/250ML-% IV SOLN
INTRAVENOUS | Status: DC | PRN
  Administered 2018-12-29: 35 ug/min via INTRAVENOUS

## 2018-12-29 MED ORDER — DOCUSATE SODIUM 100 MG PO CAPS
100.0000 mg | ORAL_CAPSULE | Freq: Two times a day (BID) | ORAL | Status: DC
  Administered 2018-12-29 – 2019-01-01 (×7): 100 mg via ORAL
  Filled 2018-12-29 (×7): qty 1

## 2018-12-29 MED ORDER — ALBUTEROL SULFATE (2.5 MG/3ML) 0.083% IN NEBU: 2.5000 mg | INHALATION_SOLUTION | Freq: Four times a day (QID) | RESPIRATORY_TRACT | Status: DC | PRN

## 2018-12-29 MED ORDER — MENTHOL 3 MG MT LOZG: 1.0000 | LOZENGE | OROMUCOSAL | Status: DC | PRN

## 2018-12-29 MED ORDER — DEXAMETHASONE SODIUM PHOSPHATE 10 MG/ML IJ SOLN
10.0000 mg | Freq: Once | INTRAMUSCULAR | Status: AC
  Administered 2018-12-30: 10 mg via INTRAVENOUS
  Filled 2018-12-29: qty 1

## 2018-12-29 MED ORDER — SODIUM CHLORIDE 0.9% IV SOLUTION: Freq: Once | INTRAVENOUS | Status: DC

## 2018-12-29 MED ORDER — BISACODYL 10 MG RE SUPP: 10.0000 mg | Freq: Every day | RECTAL | Status: DC | PRN

## 2018-12-29 MED ORDER — WARFARIN SODIUM 5 MG PO TABS
5.0000 mg | ORAL_TABLET | Freq: Once | ORAL | Status: AC
  Administered 2018-12-29: 5 mg via ORAL
  Filled 2018-12-29: qty 1

## 2018-12-29 MED ORDER — ACETAMINOPHEN 500 MG PO TABS: 500.0000 mg | ORAL_TABLET | Freq: Four times a day (QID) | ORAL | Status: AC

## 2018-12-29 MED ORDER — METOCLOPRAMIDE HCL 5 MG/ML IJ SOLN: 5.0000 mg | Freq: Three times a day (TID) | INTRAMUSCULAR | Status: DC | PRN

## 2018-12-29 MED ORDER — HYDROCODONE-ACETAMINOPHEN 5-325 MG PO TABS
1.0000 | ORAL_TABLET | ORAL | Status: DC | PRN
  Administered 2018-12-29 (×2): 2 via ORAL
  Administered 2018-12-30 (×2): 1 via ORAL
  Administered 2018-12-30: 2 via ORAL
  Administered 2018-12-30 – 2018-12-31 (×4): 1 via ORAL
  Filled 2018-12-29 (×2): qty 2
  Filled 2018-12-29: qty 1
  Filled 2018-12-29: qty 2
  Filled 2018-12-29: qty 1
  Filled 2018-12-29: qty 2
  Filled 2018-12-29 (×3): qty 1

## 2018-12-29 MED ORDER — MORPHINE SULFATE (PF) 2 MG/ML IV SOLN
0.5000 mg | INTRAVENOUS | Status: DC | PRN
  Administered 2018-12-29: 1 mg via INTRAVENOUS
  Filled 2018-12-29: qty 1

## 2018-12-29 MED ORDER — HYDROCODONE-ACETAMINOPHEN 5-325 MG PO TABS: 1.0000 | ORAL_TABLET | Freq: Four times a day (QID) | ORAL | 0 refills | Status: AC | PRN

## 2018-12-29 MED ORDER — LEVOTHYROXINE SODIUM 88 MCG PO TABS
88.0000 ug | ORAL_TABLET | Freq: Every day | ORAL | Status: DC
  Administered 2018-12-30 – 2019-01-01 (×3): 88 ug via ORAL
  Filled 2018-12-29 (×3): qty 1

## 2018-12-29 SURGICAL SUPPLY — 73 items
BAG DECANTER FOR FLEXI CONT (MISCELLANEOUS) ×3 IMPLANT
BAG ZIPLOCK 12X15 (MISCELLANEOUS) ×6 IMPLANT
BALL HIP ARTICU EZE 36 8.5 (Hips) ×1 IMPLANT
BIT DRILL 2.8X128 (BIT) ×2 IMPLANT
BIT DRILL 2.8X128MM (BIT) ×1
BLADE EXTENDED COATED 6.5IN (ELECTRODE) ×3 IMPLANT
BLADE SAW SAG 73X25 THK (BLADE)
BLADE SAW SGTL 73X25 THK (BLADE) IMPLANT
CLOSURE WOUND 1/2 X4 (GAUZE/BANDAGES/DRESSINGS) ×1
COVER SURGICAL LIGHT HANDLE (MISCELLANEOUS) ×3 IMPLANT
COVER WAND RF STERILE (DRAPES) IMPLANT
DRAPE INCISE IOBAN 66X45 STRL (DRAPES) ×3 IMPLANT
DRAPE ORTHO SPLIT 77X108 STRL (DRAPES) ×4
DRAPE POUCH INSTRU U-SHP 10X18 (DRAPES) ×3 IMPLANT
DRAPE SURG ORHT 6 SPLT 77X108 (DRAPES) ×2 IMPLANT
DRAPE U-SHAPE 47X51 STRL (DRAPES) ×3 IMPLANT
DRSG ADAPTIC 3X8 NADH LF (GAUZE/BANDAGES/DRESSINGS) ×3 IMPLANT
DRSG EMULSION OIL 3X16 NADH (GAUZE/BANDAGES/DRESSINGS) ×3 IMPLANT
DRSG MEPILEX BORDER 4X4 (GAUZE/BANDAGES/DRESSINGS) ×3 IMPLANT
DRSG MEPILEX BORDER 4X8 (GAUZE/BANDAGES/DRESSINGS) ×3 IMPLANT
DURAPREP 26ML APPLICATOR (WOUND CARE) ×3 IMPLANT
ELECT REM PT RETURN 15FT ADLT (MISCELLANEOUS) ×3 IMPLANT
EVACUATOR 1/8 PVC DRAIN (DRAIN) ×3 IMPLANT
FACESHIELD WRAPAROUND (MASK) ×12 IMPLANT
GAUZE SPONGE 4X4 12PLY STRL (GAUZE/BANDAGES/DRESSINGS) ×3 IMPLANT
GLOVE BIO SURGEON STRL SZ7 (GLOVE) ×3 IMPLANT
GLOVE BIO SURGEON STRL SZ8 (GLOVE) ×3 IMPLANT
GLOVE BIOGEL PI IND STRL 7.0 (GLOVE) ×1 IMPLANT
GLOVE BIOGEL PI IND STRL 8 (GLOVE) ×1 IMPLANT
GLOVE BIOGEL PI INDICATOR 7.0 (GLOVE) ×2
GLOVE BIOGEL PI INDICATOR 8 (GLOVE) ×2
GOWN STRL REUS W/TWL LRG LVL3 (GOWN DISPOSABLE) ×6 IMPLANT
GUIDEWIRE BALL NOSE 80CM (WIRE) ×3 IMPLANT
HANDPIECE INTERPULSE COAX TIP (DISPOSABLE)
HIP BALL ARTICU EZE 36 8.5 (Hips) ×3 IMPLANT
IMMOBILIZER KNEE 20 (SOFTGOODS)
IMMOBILIZER KNEE 20 THIGH 36 (SOFTGOODS) IMPLANT
KIT BASIN OR (CUSTOM PROCEDURE TRAY) ×3 IMPLANT
KIT TURNOVER KIT A (KITS) IMPLANT
LINER MARATHON ACE 36X54MM ×3 IMPLANT
MANIFOLD NEPTUNE II (INSTRUMENTS) ×3 IMPLANT
MARKER SKIN DUAL TIP RULER LAB (MISCELLANEOUS) ×3 IMPLANT
NDL SAFETY ECLIPSE 18X1.5 (NEEDLE) ×1 IMPLANT
NEEDLE HYPO 18GX1.5 SHARP (NEEDLE) ×2
NS IRRIG 1000ML POUR BTL (IV SOLUTION) ×3 IMPLANT
PACK TOTAL JOINT (CUSTOM PROCEDURE TRAY) ×3 IMPLANT
PASSER SUT SWANSON 36MM LOOP (INSTRUMENTS) ×3 IMPLANT
PENCIL SMOKE EVACUATOR (MISCELLANEOUS) IMPLANT
PROTECTOR NERVE ULNAR (MISCELLANEOUS) ×3 IMPLANT
RING LOCK ACET OD 54 (Hips) ×3 IMPLANT
SET HNDPC FAN SPRY TIP SCT (DISPOSABLE) IMPLANT
SLEEVE SUCTION CATH 165 (SLEEVE) ×3 IMPLANT
SPONGE LAP 18X18 RF (DISPOSABLE) ×3 IMPLANT
STAPLER VISISTAT 35W (STAPLE) ×6 IMPLANT
STEM SLVE SOLUTION STRT 8IN (Hips) ×3 IMPLANT
STRIP CLOSURE SKIN 1/2X4 (GAUZE/BANDAGES/DRESSINGS) ×2 IMPLANT
SUCTION FRAZIER HANDLE 12FR (TUBING) ×2
SUCTION TUBE FRAZIER 12FR DISP (TUBING) ×1 IMPLANT
SUT ETHIBOND NAB CT1 #1 30IN (SUTURE) IMPLANT
SUT STRATAFIX 0 PDS 27 VIOLET (SUTURE) ×3
SUT STRATAFIX PDS+ 0 24IN (SUTURE) ×3 IMPLANT
SUT VIC AB 1 CT1 27 (SUTURE) ×6
SUT VIC AB 1 CT1 27XBRD ANTBC (SUTURE) ×3 IMPLANT
SUT VIC AB 2-0 CT1 27 (SUTURE) ×6
SUT VIC AB 2-0 CT1 TAPERPNT 27 (SUTURE) ×3 IMPLANT
SUTURE STRATFX 0 PDS 27 VIOLET (SUTURE) ×1 IMPLANT
SWAB COLLECTION DEVICE MRSA (MISCELLANEOUS) IMPLANT
SWAB CULTURE ESWAB REG 1ML (MISCELLANEOUS) IMPLANT
SYR 50ML LL SCALE MARK (SYRINGE) ×3 IMPLANT
TOWEL OR 17X26 10 PK STRL BLUE (TOWEL DISPOSABLE) ×6 IMPLANT
TRAY FOLEY MTR SLVR 16FR STAT (SET/KITS/TRAYS/PACK) ×3 IMPLANT
WATER STERILE IRR 1000ML POUR (IV SOLUTION) ×6 IMPLANT
YANKAUER SUCT BULB TIP 10FT TU (MISCELLANEOUS) ×3 IMPLANT

## 2018-12-29 NOTE — Evaluation (Signed)
Physical Therapy Evaluation Patient Details Name: Makayla Schultz MRN: [id number] DOB: 02/15/1930 Today's Date: 12/29/2018   History of Present Illness  Patient is 85 y.o. female s/p Rt THR posterior approach with PMH significant for pulmonary HTN, neuropathy, HLD, dyspnea, asthma, and OA.    Clinical Impression  Makayla Schultz is a 85 y.o. female POD 0 s/p Rt THR with posterior hip precautions. Patient reports modified independence with mobility at baseline with RW. Patient is now limited by functional impairments (see PT problem list below) and requires min/mod assist for transfers and gait with RW. Patient was able to ambulate ~25 feet with RW and min assist with cues for safe step pattern. Patient instructed in exercise to facilitate circulation. Patient will benefit from continued skilled PT interventions to address impairments and progress towards PLOF. Acute PT will follow to progress mobility and stair training in preparation for safe discharge home.    Follow Up Recommendations Follow surgeon's recommendation for DC plan and follow-up therapies    Equipment Recommendations  None recommended by PT    Recommendations for Other Services       Precautions / Restrictions Precautions Precautions: Fall;Posterior Hip Precaution Booklet Issued: Yes (comment) Required Braces or Orthoses: Knee Immobilizer - Right Knee Immobilizer - Right: (for post hip precautions) Restrictions Weight Bearing Restrictions: No      Mobility  Bed Mobility Overal bed mobility: Needs Assistance Bed Mobility: Supine to Sit     Supine to sit: HOB elevated;Mod assist     General bed mobility comments: cues required for use of bed rails and to sequence LE mobility to turn and sit, assist for bil LE mobilty and to raise trunk safely. cues requried to maintain post hip precautions.  Transfers Overall transfer level: Needs assistance Equipment used: Rolling walker (2 wheeled) Transfers: Sit to/from  Stand Sit to Stand: From elevated surface;Mod assist         General transfer comment: verbal cues for hand placement and technique with RW, mod assist to initiate power up and complete rise to stand.  Ambulation/Gait Ambulation/Gait assistance: Min assist Gait Distance (Feet): 25 Feet Assistive device: Rolling walker (2 wheeled) Gait Pattern/deviations: Step-to pattern;Decreased step length - left;Decreased stance time - right;Decreased stride length;Decreased weight shift to right;Wide base of support Gait velocity: decreased   General Gait Details: pt slow to mobilize. verbal cues required for step pattern and to sequence RW. pt maintained safe proximity and use of RW throughout gait. pt limited by fatigue and feeling over heated.  Stairs            Wheelchair Mobility    Modified Rankin (Stroke Patients Only)       Balance Overall balance assessment: Needs assistance Sitting-balance support: No upper extremity supported;Feet supported Sitting balance-Leahy Scale: Fair     Standing balance support: During functional activity;Bilateral upper extremity supported Standing balance-Leahy Scale: Poor          Pertinent Vitals/Pain Pain Assessment: 0-10 Pain Score: 6  Pain Location: Rt hip Pain Descriptors / Indicators: Aching Pain Intervention(s): Limited activity within patient's tolerance;Monitored during session;Repositioned    Home Living Family/patient expects to be discharged to:: Private residence Living Arrangements: Alone Available Help at Discharge: Family;Available 24 hours/day(pts daughter and son will alternate who stays with her; will stay with and assist for ~week or as much as needed) Type of Home: House Home Access: Level entry     Home Layout: One level Home Equipment: Walker - 2 wheels;Bedside commode;Cane - single point;Grab bars -  tub/shower      Prior Function Level of Independence: Independent with assistive device(s)                Hand Dominance   Dominant Hand: Right    Extremity/Trunk Assessment   Upper Extremity Assessment Upper Extremity Assessment: Generalized weakness    Lower Extremity Assessment Lower Extremity Assessment: Generalized weakness;RLE deficits/detail RLE Deficits / Details: pt with posterior hip precautions and immobilizer on and used for mobility RLE: Unable to fully assess due to immobilization RLE Sensation: history of peripheral neuropathy RLE Coordination: WNL    Cervical / Trunk Assessment Cervical / Trunk Assessment: Normal  Communication   Communication: No difficulties  Cognition Arousal/Alertness: Awake/alert Behavior During Therapy: WFL for tasks assessed/performed Overall Cognitive Status: Within Functional Limits for tasks assessed           General Comments      Exercises Total Joint Exercises Ankle Circles/Pumps: AROM;15 reps;Seated;Both   Assessment/Plan    PT Assessment Patient needs continued PT services  PT Problem List Decreased strength;Decreased range of motion;Decreased mobility;Decreased balance;Decreased activity tolerance       PT Treatment Interventions DME instruction;Functional mobility training;Balance training;Patient/family education;Modalities;Gait training;Therapeutic activities;Stair training;Therapeutic exercise    PT Goals (Current goals can be found in the Care Plan section)  Acute Rehab PT Goals Patient Stated Goal: to return home and be able to walk better PT Goal Formulation: With patient Time For Goal Achievement: 01/05/19 Potential to Achieve Goals: Good    Frequency 7X/week    AM-PAC PT "6 Clicks" Mobility  Outcome Measure Help needed turning from your back to your side while in a flat bed without using bedrails?: A Little Help needed moving from lying on your back to sitting on the side of a flat bed without using bedrails?: A Little Help needed moving to and from a bed to a chair (including a wheelchair)?: A  Lot Help needed standing up from a chair using your arms (e.g., wheelchair or bedside chair)?: A Lot Help needed to walk in hospital room?: A Little Help needed climbing 3-5 steps with a railing? : A Lot 6 Click Score: 15    End of Session Equipment Utilized During Treatment: Gait belt Activity Tolerance: Patient tolerated treatment well Patient left: in chair;with call bell/phone within reach;with family/visitor present;with chair alarm set Nurse Communication: Mobility status PT Visit Diagnosis: Muscle weakness (generalized) (M62.81);Difficulty in walking, not elsewhere classified (R26.2)    Time: 6283-1517 PT Time Calculation (min) (ACUTE ONLY): 24 min   Charges:   PT Evaluation $PT Eval Low Complexity: 1 Low PT Treatments $Gait Training: 8-22 mins        Kipp Brood, PT, DPT Physical Therapist with [facility]  12/29/2018 5:29 PM

## 2018-12-29 NOTE — Anesthesia Postprocedure Evaluation (Signed)
Anesthesia Post Note  Patient: Makayla Schultz  Procedure(s) Performed: TOTAL HIP REVISION (Right Hip)     Patient location during evaluation: PACU Anesthesia Type: Spinal Level of consciousness: awake and alert, oriented and patient cooperative Pain management: pain level controlled Vital Signs Assessment: post-procedure vital signs reviewed and stable Respiratory status: spontaneous breathing, nonlabored ventilation and respiratory function stable Cardiovascular status: blood pressure returned to baseline and stable Postop Assessment: no apparent nausea or vomiting, spinal receding, no headache, no backache and patient able to bend at knees Anesthetic complications: no    Last Vitals:  Vitals:   12/29/18 1015 12/29/18 1030  BP: 120/71 (!) 109/43  Pulse: 80 78  Resp: 13 19  Temp: 36.4 C   SpO2: 100% 100%    Last Pain:  Vitals:   12/29/18 1015  TempSrc:   PainSc: 0-No pain                 Pervis Hocking

## 2018-12-29 NOTE — Discharge Instructions (Signed)
° °Dr. Frank Aluisio °Total Joint Specialist °[facility] °[address] °, [place] [address] °[phone] ° °POSTERIOR TOTAL HIP REVISION POSTOPERATIVE DIRECTIONS ° °Hip Rehabilitation, Guidelines Following Surgery  °The results of a hip operation are greatly improved after range of motion and muscle strengthening exercises. Follow all safety measures which are given to protect your hip. If any of these exercises cause increased pain or swelling in your joint, decrease the amount until you are comfortable again. Then slowly increase the exercises. Call your caregiver if you have problems or questions.  ° °HOME CARE INSTRUCTIONS  °• Remove items at home which could result in a fall. This includes throw rugs or furniture in walking pathways.  °· ICE to the affected hip every three hours for 30 minutes at a time and then as needed for pain and swelling.  Continue to use ice on the hip for pain and swelling from surgery. You may notice swelling that will progress down to the foot and ankle.  This is normal after surgery.  Elevate the leg when you are not up walking on it.   °· Continue to use the breathing machine which will help keep your temperature down.  It is common for your temperature to cycle up and down following surgery, especially at night when you are not up moving around and exerting yourself.  The breathing machine keeps your lungs expanded and your temperature down. ° °DIET °You may resume your previous home diet once your are discharged from the hospital. ° °DRESSING / WOUND CARE / SHOWERING °You may change your dressing 3-5 days after surgery.  Then change the dressing every day with sterile gauze.  Please use good hand washing techniques before changing the dressing.  Do not use any lotions or creams on the incision until instructed by your surgeon. °You may start showering once you are discharged home but do not submerge the incision under water. Just pat the incision dry and  apply a dry gauze dressing on daily. °Change the surgical dressing daily and reapply a dry dressing each time. ° °ACTIVITY °Walk with your walker as instructed. °Use walker as long as suggested by your caregivers. °Avoid periods of inactivity such as sitting longer than an hour when not asleep. This helps prevent blood clots.  °You may resume a sexual relationship in one month or when given the OK by your doctor.  °You may return to work once you are cleared by your doctor.  °Do not drive a car for 6 weeks or until released by you surgeon.  °Do not drive while taking narcotics. ° °WEIGHT BEARING °Weight bearing as tolerated with assist device (walker, cane, etc) as directed, use it as long as suggested by your surgeon or therapist, typically at least 4-6 weeks. ° °POSTOPERATIVE CONSTIPATION PROTOCOL °Constipation - defined medically as fewer than three stools per week and severe constipation as less than one stool per week. ° °One of the most common issues patients have following surgery is constipation.  Even if you have a regular bowel pattern at home, your normal regimen is likely to be disrupted due to multiple reasons following surgery.  Combination of anesthesia, postoperative narcotics, change in appetite and fluid intake all can affect your bowels.  In order to avoid complications following surgery, here are some recommendations in order to help you during your recovery period. ° °Colace (docusate) - Pick up an over-the-counter form of Colace or another stool softener and take twice a day as   long as you are requiring postoperative pain medications.  Take with a full glass of water daily.  If you experience loose stools or diarrhea, hold the colace until you stool forms back up.  If your symptoms do not get better within 1 week or if they get worse, check with your doctor. ° °Dulcolax (bisacodyl) - Pick up over-the-counter and take as directed by the product packaging as needed to assist with the movement of  your bowels.  Take with a full glass of water.  Use this product as needed if not relieved by Colace only.  ° °MiraLax (polyethylene glycol) - Pick up over-the-counter to have on hand.  MiraLax is a solution that will increase the amount of water in your bowels to assist with bowel movements.  Take as directed and can mix with a glass of water, juice, soda, coffee, or tea.  Take if you go more than two days without a movement. °Do not use MiraLax more than once per day. Call your doctor if you are still constipated or irregular after using this medication for 7 days in a row. ° °If you continue to have problems with postoperative constipation, please contact the office for further assistance and recommendations.  If you experience "the worst abdominal pain ever" or develop nausea or vomiting, please contact the office immediatly for further recommendations for treatment. ° °ITCHING ° If you experience itching with your medications, try taking only a single pain pill, or even half a pain pill at a time.  You can also use Benadryl over the counter for itching or also to help with sleep.  ° °TED HOSE STOCKINGS °Wear the elastic stockings on both legs for three weeks following surgery during the day but you may remove then at night for sleeping. ° °MEDICATIONS °See your medication summary on the “After Visit Summary” that the nursing staff will review with you prior to discharge.  You may have some home medications which will be placed on hold until you complete the course of blood thinner medication.  It is important for you to complete the blood thinner medication as prescribed by your surgeon.  Continue your approved medications as instructed at time of discharge. ° °PRECAUTIONS °If you experience chest pain or shortness of breath - call 911 immediately for transfer to the hospital emergency department.  °If you develop a fever greater that 101 F, purulent drainage from wound, increased redness or drainage from  wound, foul odor from the wound/dressing, or calf pain - CONTACT YOUR SURGEON.   °                                                °FOLLOW-UP APPOINTMENTS °Make sure you keep all of your appointments after your operation with your surgeon and caregivers. You should call the office at the above phone number and make an appointment for approximately two weeks after the date of your surgery or on the date instructed by your surgeon outlined in the "After Visit Summary". ° °RANGE OF MOTION AND STRENGTHENING EXERCISES  °These exercises are designed to help you keep full movement of your hip joint. Follow your caregiver's or physical therapist's instructions. Perform all exercises about fifteen times, three times per day or as directed. Exercise both hips, even if you have had only one joint replacement. These exercises can be done on a   training (exercise) mat, on the floor, on a table or on a bed. Use whatever works the best and is most comfortable for you. Use music or television while you are exercising so that the exercises are a pleasant break in your day. This will make your life better with the exercises acting as a break in routine you can look forward to.  °• Lying on your back, slowly slide your foot toward your buttocks, raising your knee up off the floor. Then slowly slide your foot back down until your leg is straight again.  °• Lying on your back spread your legs as far apart as you can without causing discomfort.  °• Lying on your side, raise your upper leg and foot straight up from the floor as far as is comfortable. Slowly lower the leg and repeat.  °• Lying on your back, tighten up the muscle in the front of your thigh (quadriceps muscles). You can do this by keeping your leg straight and trying to raise your heel off the floor. This helps strengthen the largest muscle supporting your knee.  °• Lying on your back, tighten up the muscles of your buttocks both with the legs straight and with the knee bent  at a comfortable angle while keeping your heel on the floor.  ° °IF YOU ARE TRANSFERRED TO A SKILLED REHAB FACILITY °If the patient is transferred to a skilled rehab facility following release from the hospital, a list of the current medications will be sent to the facility for the patient to continue.  When discharged from the skilled rehab facility, please have the facility set up the patient's Home Health Physical Therapy prior to being released. Also, the skilled facility will be responsible for providing the patient with their medications at time of release from the facility to include their pain medication, the muscle relaxants, and their blood thinner medication. If the patient is still at the rehab facility at time of the two week follow up appointment, the skilled rehab facility will also need to assist the patient in arranging follow up appointment in our office and any transportation needs. ° °MAKE SURE YOU:  °• Understand these instructions.  °• Get help right away if you are not doing well or get worse.  ° ° °Pick up stool softner and laxative for home use following surgery while on pain medications. °Do not submerge incision under water. °Please use good hand washing techniques while changing dressing each day. °May shower starting three days after surgery. °Please use a clean towel to pat the incision dry following showers. °Continue to use ice for pain and swelling after surgery. °Do not use any lotions or creams on the incision until instructed by your surgeon. ° °

## 2018-12-29 NOTE — Progress Notes (Signed)
[place] for Warfarin Indication: hx DVT,  Allergies  Allergen Reactions  . Lasix [Furosemide] Other (See Comments)    Blood pressure dropped   . Statins Other (See Comments)    Cramps in legs and arms  . Tape Other (See Comments)    Cannot use paper tape clear tape takes the skin off  . Penicillins Rash    Did it involve swelling of the face/tongue/throat, SOB, or low BP? No Did it involve sudden or severe rash/hives, skin peeling, or any reaction on the inside of your mouth or nose? No Did you need to seek medical attention at a hospital or doctor's office? No When did it last happen?30 years ago If all above answers are "NO", may proceed with cephalosporin use.   Patient Measurements: Height: 5\' 5"  (165.1 cm) Weight: 187 lb 4 oz (84.9 kg) IBW/kg (Calculated) : 57  Vital Signs: Temp: 97.6 F (36.4 C) (11/25 1015) Temp Source: Oral (11/25 0555) BP: 145/81 (11/25 1146) Pulse Rate: 78 (11/25 1146)  Labs: Recent Labs    12/27/18 1003 12/29/18 0555  HGB 12.1  --   HCT 39.7  --   PLT 276  --   APTT 36  --   LABPROT 21.1* 15.4*  INR 1.8* 1.2  CREATININE 0.60  --    Estimated Creatinine Clearance: 54.3 mL/min (by C-G formula based on SCr of 0.6 mg/dL).  Medical History: Past Medical History:  Diagnosis Date  . Anemia   . Antiphospholipid syndrome (HCC)    Dr. Marveen Reeks  . Arthritis   . Asthma   . Chronic venous insufficiency   . Dyspnea   . Dysrhythmia    PVC  . Fatigue   . History of blood clots    Left lower leg  . History of colon polyps   . Hyperlipidemia   . 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 Neuropathy   . Post-thrombotic syndrome   . Pulmonary hypertension ([place])   . PVC (premature ventricular contraction)   . Venous ulcer of ankle, left (HCC)    Medications:  Scheduled:  . acetaminophen  500 mg Oral Q6H  . [START ON 12/30/2018] dexamethasone (DECADRON) injection  10 mg Intravenous Once  . docusate  sodium  100 mg Oral BID  . [START ON 12/30/2018] enoxaparin (LOVENOX) injection  40 mg Subcutaneous Q24H  . letrozole  2.5 mg Oral QHS  . [START ON 12/30/2018] levothyroxine  88 mcg Oral QAC breakfast  . pantoprazole  40 mg Oral Daily    Assessment: 6 yoF s/p R THR, original THA in 2003. PTA Warfarin for hx DVT/PE, 5mg  daily with last dose 11/20. Baseline INR operative day = 1.2  Goal of Therapy:  INR 2-3 Monitor platelets by anticoagulation protocol: Yes   Plan:  Lovenox 40mg  daily from 11/26 Warfarin 5mg  today at 1800 Daily Protime, monitor CBC, s/s bleed  Minda Ditto PharmD 12/29/2018,12:12 PM

## 2018-12-29 NOTE — Op Note (Signed)
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  PROCEDURE:  Right total hip arthroplasty revision.  SURGEON:  Ollen Gross, MD  ASSISTANT:  Arther Abbott, PA-C  ANESTHESIA:  Spinal.  ESTIMATED BLOOD LOSS:  650 mL.  DRAINS:  Hemovac x1.  COMPLICATIONS:  None.  CONDITION:  Stable to recovery.  BRIEF CLINICAL NOTE:  The patient is an 85 year old female who had a right total hip arthroplasty done in approximately 2003.  She has had progressively worsening thigh pain over the past year.  X-rays in the office from 2 months ago showed that she had  a grossly loose cemented femoral stem.  She presents now for right total hip arthroplasty revision.  PROCEDURE IN DETAIL:  After successful administration of spinal anesthetic, the patient was placed in the left lateral decubitus position with the right side up and held with a hip positioner.  Right lower extremity was isolated from her perineum with  plastic drapes and prepped and draped in the usual sterile fashion.  A posterolateral incision was made with a 10 blade through the subcutaneous tissue to the level of the fascia lata, which was incised in line with the skin incision.  Sciatic nerve was  palpated and protected.  Posterior pseudocapsule was incised and elevated off the femur to reveal the hip joint.  The stem was grossly loose and was rotating within the canal.  The hip was dislocated and the femoral head removed.  I had to remove soft  tissue around the proximal aspect of the stem to access this completely.  I was able to remove the stem without difficulty.  The proximal cement  came out easily.  We then utilized the [place] osteotomes to carefully remove the cement from the femoral  canal.  Once the cement was removed down to the cement restrictor, I was able to drill through the cement restrictor and removed that.  We started our femoral reaming by placing a guide rod and started 8 mm up to 13.5 mm, which was the largest reamer over the guide rod.  The guide rod was removed and then the thin shaft reamers were used to ream up to 14.5 mm, which had excellent  cortical chatter.  This was for placement of a 15 mm Solution stem.  We then broached, starting at a 12 up to a 15, which had excellent fit and excellent stability.  The broach was removed and then the femoral canal packed with a sponge and then  retracted anteriorly to gain acetabular exposure.  Previously, it was only a 28 mm head, so I wanted to upsize and I removed the acetabular liner from the acetabular shell.  This was a Duraloc LOC acetabular shell in excellent position.  It was well fixed.  There was a 54 mm cup.  I went up to a 36 mm  neutral +4 Marathon liner.  We first placed the locking ring into the cup and then impacted the liner into the 54 mm acetabular shell and it locked into position.  We removed the packing sponge from the femur.  We then placed the trial 8 inch 15 mm Solution stem with the small proximal portion and impacted that with excellent stability.  We did this in  about 20 degrees of anteversion.  In order to get length back,  we felt it was going to be an 8.5, 36 mm head.  That trial was placed and the hip was reduced with outstanding stability.  Full extension, full external rotation, 70 degrees flexion, 40 degrees, adduction 90 degrees, internal rotation, then 90 degrees of  flexion and 70 degrees of internal rotation.  By placing the right leg on top of the left, it felt as though the leg lengths were equal.  She was about an inch short preoperatively.  The trials were removed and then the  permanent 8 inch, 15 mm Solution  stem was impacted into the femur in about 20 degrees of anteversion.  We got up to the appropriate depth that we were seeking.  The 30+8.5 metal head was placed and the hip was reduced with the same stability parameters.  X-rays taken of the femur, AP  plane showing that the stem had excellent fit and fill within the femur and there were no associated fractures.  The wound was then copiously irrigated with saline solution and the short rotators were reattached to the femur through drill holes with  Ethibond suture.  Fascia lata was closed over a Hemovac drain with a running 0 Stratafix suture.  Subcutaneous was injected with 30 mL of 0.25% Marcaine.  There was a very thick subcutaneous layer, so I did the deep subcutaneous with another running  Stratafix suture.  Superficial subcutaneous with interrupted 2-0 Vicryl and then skin closed with staples.  The incision was cleaned and dried and a bulky sterile dressing applied.  Drain was hooked to suction and she was placed into a knee immobilizer,  awakened and transported to recovery in stable condition.  VN/NUANCE  D:12/29/2018 T:12/29/2018 JOB:[id number]

## 2018-12-29 NOTE — Progress Notes (Signed)
Therapy Plan: [facility] Has DME 

## 2018-12-29 NOTE — Interval H&P Note (Signed)
History and Physical Interval Note:  12/29/2018 6:28 AM  Makayla Schultz  has presented today for surgery, with the diagnosis of Failed Right total hip arthroplasty.  The various methods of treatment have been discussed with the patient and family. After consideration of risks, benefits and other options for treatment, the patient has consented to  Procedure(s) with comments: TOTAL HIP REVISION (Right) - 149min as a surgical intervention.  The patient's history has been reviewed, patient examined, no change in status, stable for surgery.  I have reviewed the patient's chart and labs.  Questions were answered to the patient's satisfaction.     Pilar Plate Maille Halliwell

## 2018-12-29 NOTE — Anesthesia Procedure Notes (Signed)
Spinal  Patient location during procedure: OR Start time: 12/29/2018 7:10 AM End time: 12/29/2018 7:15 AM Staffing Anesthesiologist: Pervis Hocking, DO Performed: anesthesiologist  Preanesthetic Checklist Completed: patient identified, surgical consent, pre-op evaluation, timeout performed, IV checked, risks and benefits discussed and monitors and equipment checked Spinal Block Patient position: sitting Prep: site prepped and draped and DuraPrep Patient monitoring: cardiac monitor, continuous pulse ox and blood pressure Approach: midline Location: L3-4 Injection technique: single-shot Needle Needle type: Pencan  Needle gauge: 24 G Needle length: 9 cm Assessment Sensory level: T6 Additional Notes Functioning IV was confirmed and monitors were applied. Sterile prep and drape, including hand hygiene and sterile gloves were used. The patient was positioned and the spine was prepped. The skin was anesthetized with lidocaine.  Free flow of clear CSF was obtained prior to injecting local anesthetic into the CSF.  The spinal needle aspirated freely following injection.  The needle was carefully withdrawn.  The patient tolerated the procedure well.

## 2018-12-29 NOTE — Brief Op Note (Signed)
12/29/2018  10:14 AM  PATIENT:  Makayla Schultz  85 y.o. female  PRE-OPERATIVE DIAGNOSIS:  Failed Right total hip arthroplasty  POST-OPERATIVE DIAGNOSIS:  Failed Right total hip arthroplasty  PROCEDURE:  Procedure(s) with comments: TOTAL HIP REVISION (Right) - 12min  SURGEON:  Surgeon(s) and Role:    Gaynelle Arabian, MD - Primary  PHYSICIAN ASSISTANT:   ASSISTANTS: Theresa Duty, PA-C   ANESTHESIA:   spinal  EBL:  650 mL   BLOOD ADMINISTERED:none  DRAINS: (Medium) Hemovact drain(s) in the right hip with  Suction Open   LOCAL MEDICATIONS USED:  MARCAINE     COUNTS:  YES  TOURNIQUET:  * No tourniquets in log *  DICTATION: .Other Dictation: Dictation Number [id number]  PLAN OF CARE: Admit to inpatient   PATIENT DISPOSITION:  PACU - hemodynamically stable.

## 2018-12-29 NOTE — Transfer of Care (Signed)
Immediate Anesthesia Transfer of Care Note  Patient: Makayla Schultz  Procedure(s) Performed: Procedure(s) with comments: TOTAL HIP REVISION (Right) - 129min  Patient Location: PACU  Anesthesia Type:Spinal  Level of Consciousness:  sedated, patient cooperative and responds to stimulation  Airway & Oxygen Therapy:Patient Spontanous Breathing and Patient connected to face mask oxgen  Post-op Assessment:  Report given to PACU RN and Post -op Vital signs reviewed and stable  Post vital signs:  Reviewed and stable  Last Vitals:  Vitals:   12/29/18 0555  BP: (!) 168/84  Pulse: (!) 107  Resp: 16  Temp: 36.7 C  SpO2: 301%    Complications: No apparent anesthesia complications

## 2018-12-30 LAB — BASIC METABOLIC PANEL
Anion gap: 7 (ref 5–15)
BUN: 15 mg/dL (ref 8–23)
CO2: 27 mmol/L (ref 22–32)
Calcium: 7.9 mg/dL — ABNORMAL LOW (ref 8.9–10.3)
Chloride: 102 mmol/L (ref 98–111)
Creatinine, Ser: 0.5 mg/dL (ref 0.44–1.00)
GFR calc Af Amer: >60 mL/min (ref >60)
GFR calc non Af Amer: >60 mL/min (ref >60)
Glucose, Bld: 135 mg/dL — ABNORMAL HIGH (ref 70–99)
Potassium: 3.8 mmol/L (ref 3.5–5.1)
Sodium: 136 mmol/L (ref 135–145)

## 2018-12-30 LAB — PROTIME-INR
INR: 1.3 — ABNORMAL HIGH (ref 0.8–1.2)
Prothrombin Time: 16.1 seconds — ABNORMAL HIGH (ref 11.4–15.2)

## 2018-12-30 LAB — CBC
HCT: 26 % — ABNORMAL LOW (ref 36.0–46.0)
Hemoglobin: 8.1 g/dL — ABNORMAL LOW (ref 12.0–15.0)
MCH: 28.5 pg (ref 26.0–34.0)
MCHC: 31.2 g/dL (ref 30.0–36.0)
MCV: 91.5 fL (ref 80.0–100.0)
Platelets: 201 10*3/uL (ref 150–400)
RBC: 2.84 MIL/uL — ABNORMAL LOW (ref 3.87–5.11)
RDW: 13.9 % (ref 11.5–15.5)
WBC: 8.4 10*3/uL (ref 4.0–10.5)
nRBC: 0 % (ref 0.0–0.2)

## 2018-12-30 MED ORDER — WARFARIN SODIUM 5 MG PO TABS
7.5000 mg | ORAL_TABLET | Freq: Once | ORAL | Status: AC
  Administered 2018-12-30: 7.5 mg via ORAL
  Filled 2018-12-30: qty 1

## 2018-12-30 NOTE — Progress Notes (Signed)
Physical Therapy Treatment Patient Details Name: Makayla Schultz MRN: [id number] DOB: 01-20-1931 Today's Date: 12/30/2018    History of Present Illness Patient is 85 y.o. female s/p Rt THR posterior approach with PMH significant for pulmonary HTN, neuropathy, HLD, dyspnea, asthma, and OA.    PT Comments    Patient is POD 1 s/p Rt THR posterior approach. Reviewed posterior hip precautions at start of session. Pt was able to raise trunk without assist today from flat bed and required intermittent cues to deter hip flexion past 90 degrees. Pt requires assist for Rt LE mobility to move to EOB with use of gait belt. She continues to require mod assist to initiate power up for sit to stand from lower bed surface with cues for Rt foot position to maintain precautions. Patient was able to further gait training today and demonstrates good safety awareness with RW. She was instructed on supine and seated exercise for HEP to progress functional strength and mobility. Pt will benefit from additional skilled Acute PT interventions to review HEP and bed mobility/transfers for safe return home.   Follow Up Recommendations  Follow surgeon's recommendation for DC plan and follow-up therapies     Equipment Recommendations  None recommended by PT    Recommendations for Other Services       Precautions / Restrictions Precautions Precautions: Fall;Posterior Hip Restrictions Weight Bearing Restrictions: No    Mobility  Bed Mobility Overal bed mobility: Needs Assistance Bed Mobility: Supine to Sit     Supine to sit: Min assist     General bed mobility comments: pt able to raise trunk to sit up in long sitting position, verbal cues to maintain hip precautions and deter flexion past 90*. Pt required min assist to maintain trunk upright and verbal/tactile cues to use gait belt to bring Rt LE out of bed.  Transfers Overall transfer level: Needs assistance Equipment used: Rolling walker (2  wheeled) Transfers: Sit to/from Stand Sit to Stand: Mod assist         General transfer comment: pt continues to require verbal/tactile cues for hand placement and technique with RW, mod assist to initiate power up from low bed height (pt reports her bed is about that high) and complete rise to stand. cues required for Rt foot positioning to maintain precautions and avoid hip flexion past 90*.  Ambulation/Gait Ambulation/Gait assistance: Min assist Gait Distance (Feet): 75 Feet Assistive device: Rolling walker (2 wheeled) Gait Pattern/deviations: Step-to pattern;Decreased step length - left;Decreased stance time - right;Decreased stride length;Decreased weight shift to right Gait velocity: decreased   General Gait Details: pt remains slow to move, pt demonstrated good step sequencing with RW and maintained safe proximity during forward gait and turns. no overt LOB noted during gait.   Stairs      Wheelchair Mobility    Modified Rankin (Stroke Patients Only)       Balance Overall balance assessment: Needs assistance Sitting-balance support: No upper extremity supported;Feet supported Sitting balance-Leahy Scale: Fair     Standing balance support: During functional activity;Bilateral upper extremity supported Standing balance-Leahy Scale: Poor       Cognition Arousal/Alertness: Awake/alert Behavior During Therapy: WFL for tasks assessed/performed Overall Cognitive Status: Within Functional Limits for tasks assessed         Exercises Total Joint Exercises Ankle Circles/Pumps: AROM;15 reps;Seated;Both Quad Sets: AROM;10 reps;Seated;Right Heel Slides: AROM;Supine;10 reps;Right Hip ABduction/ADduction: AROM;Supine;10 reps;Right    General Comments        Pertinent Vitals/Pain Pain Assessment: Faces Faces Pain  Scale: Hurts little more Pain Location: Rt hip Pain Descriptors / Indicators: Aching;Sore;Guarding Pain Intervention(s): Limited activity within  patient's tolerance;Monitored during session;Repositioned;Ice applied           PT Goals (current goals can now be found in the care plan section) Acute Rehab PT Goals Patient Stated Goal: to return home and be able to walk better PT Goal Formulation: With patient Time For Goal Achievement: 01/05/19 Potential to Achieve Goals: Good Progress towards PT goals: Progressing toward goals    Frequency    7X/week      PT Plan Current plan remains appropriate       AM-PAC PT "6 Clicks" Mobility   Outcome Measure  Help needed turning from your back to your side while in a flat bed without using bedrails?: A Little Help needed moving from lying on your back to sitting on the side of a flat bed without using bedrails?: A Little Help needed moving to and from a bed to a chair (including a wheelchair)?: A Lot Help needed standing up from a chair using your arms (e.g., wheelchair or bedside chair)?: A Lot Help needed to walk in hospital room?: A Little Help needed climbing 3-5 steps with a railing? : A Lot 6 Click Score: 15    End of Session Equipment Utilized During Treatment: Gait belt Activity Tolerance: Patient tolerated treatment well Patient left: in chair;with call bell/phone within reach;with chair alarm set Nurse Communication: Mobility status PT Visit Diagnosis: Muscle weakness (generalized) (M62.81);Difficulty in walking, not elsewhere classified (R26.2)     Time: 1610-9604 PT Time Calculation (min) (ACUTE ONLY): 28 min  Charges:  $Gait Training: 8-22 mins $Therapeutic Exercise: 8-22 mins                     Valentino Saxon, PT, DPT Physical Therapist with [facility]  12/30/2018 10:26 AM

## 2018-12-30 NOTE — Plan of Care (Signed)
Continue current POC 

## 2018-12-30 NOTE — Progress Notes (Signed)
   Subjective: 1 Day Post-Op Procedure(s) (LRB): TOTAL HIP REVISION (Right) Patient reports pain as mild.   Plan is to go Home after hospital stay.  Objective: Vital signs in last 24 hours: Temp:  [97.5 F (36.4 C)-98.4 F (36.9 C)] 98.3 F (36.8 C) (11/26 0534) Pulse Rate:  [78-101] 84 (11/26 0534) Resp:  [13-26] 19 (11/26 0534) BP: (109-145)/(43-81) 110/54 (11/26 0534) SpO2:  [99 %-100 %] 100 % (11/26 0534)  Intake/Output from previous day:  Intake/Output Summary (Last 24 hours) at 12/30/2018 0826 Last data filed at 12/30/2018 0818 Gross per 24 hour  Intake 3709.82 ml  Output 3950 ml  Net -240.18 ml    Intake/Output this shift: Total I/O In: 240 [P.O.:240] Out: -   Labs: Recent Labs    12/27/18 1003 12/30/18 0243  HGB 12.1 8.1*   Recent Labs    12/27/18 1003 12/30/18 0243  WBC 6.1 8.4  RBC 4.32 2.84*  HCT 39.7 26.0*  PLT 276 201   Recent Labs    12/27/18 1003 12/30/18 0243  NA 138 136  K 4.0 3.8  CL 102 102  CO2 26 27  BUN 18 15  CREATININE 0.60 0.50  GLUCOSE 92 135*  CALCIUM 9.1 7.9*   Recent Labs    12/29/18 0555 12/30/18 0243  INR 1.2 1.3*    EXAM General - Patient is Alert, Appropriate and Oriented Extremity - Neurologically intact Neurovascular intact No cellulitis present Compartment soft Dressing - dressing C/D/I Motor Function - intact, moving foot and toes well on exam.  Hemovac pulled without difficulty.  Past Medical History:  Diagnosis Date  . Anemia   . Antiphospholipid syndrome (HCC)    Dr. Marveen Reeks  . Arthritis   . Asthma   . Chronic venous insufficiency   . Dyspnea   . Dysrhythmia    PVC  . Fatigue   . History of blood clots    Left lower leg  . History of colon polyps   . Hyperlipidemia   . 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 Neuropathy   . Post-thrombotic syndrome   . Pulmonary hypertension ([place])   . PVC (premature ventricular contraction)   . Venous ulcer of ankle, left (HCC)      Assessment/Plan: 1 Day Post-Op Procedure(s) (LRB): TOTAL HIP REVISION (Right) Principal Problem:   Failed total hip arthroplasty (HCC)   Advance diet Up with therapy D/C IV fluids Plan for discharge tomorrow  DVT Prophylaxis - Lovenox and Coumadin Weight Bearing As Tolerated right Leg Hemovac Pulled   Pilar Plate Adyson Vanburen

## 2018-12-30 NOTE — Progress Notes (Signed)
Makayla Schultz for Warfarin Indication: hx DVT,  Allergies  Allergen Reactions  . Lasix [Furosemide] Other (See Comments)    Blood pressure dropped   . Statins Other (See Comments)    Cramps in legs and arms  . Tape Other (See Comments)    Cannot use paper tape clear tape takes the skin off  . Penicillins Rash    Did it involve swelling of the face/tongue/throat, SOB, or low BP? No Did it involve sudden or severe rash/hives, skin peeling, or any reaction on the inside of your mouth or nose? No Did you need to seek medical attention at a hospital or doctor's office? No When did it last happen?30 years ago If all above answers are "NO", may proceed with cephalosporin use.   Patient Measurements: Height: 5\' 5"  (165.1 cm) Weight: 187 lb 4 oz (84.9 kg) IBW/kg (Calculated) : 57  Vital Signs: Temp: 98.7 F (37.1 C) (11/26 1014) Temp Source: Oral (11/26 1014) BP: 136/62 (11/26 1014) Pulse Rate: 111 (11/26 1014)  Labs: Recent Labs    12/29/18 0555 12/30/18 0243  HGB  --  8.1*  HCT  --  26.0*  PLT  --  201  LABPROT 15.4* 16.1*  INR 1.2 1.3*  CREATININE  --  0.50   Estimated Creatinine Clearance: 54.3 mL/min (by C-G formula based on SCr of 0.5 mg/dL).  Medications:  Scheduled:  . acetaminophen  500 mg Oral Q6H  . dexamethasone (DECADRON) injection  10 mg Intravenous Once  . docusate sodium  100 mg Oral BID  . enoxaparin (LOVENOX) injection  40 mg Subcutaneous Q24H  . letrozole  2.5 mg Oral QHS  . levothyroxine  88 mcg Oral QAC breakfast  . pantoprazole  40 mg Oral Daily  . Warfarin - Pharmacist Dosing Inpatient   Does not apply q1800    Assessment: 4 yoF s/p R THR, original THA in 2003. Pharmacy to continue warfarin postop.   Baseline INR low as expected  Prior anticoagulation: warfarin 5 mg daily, LD 11/20  Significant events:  Today, 12/30/2018:  CBC: Hgb dropped 4g (not uncommon post ortho procedure), Plt remain  WNL  INR still SUBtherapeutic but slightly improved  Major drug interactions: none noted; on Lovenox ppx while INR low  No bleeding issues per nursing  Eating 100% of meals  Goal of Therapy: INR 2-3  Plan:  Warfarin 7.5 mg PO tonight at 18:00  Daily INR  CBC at least q72 hr while on warfarin  Monitor for signs of bleeding or thrombosis   Reuel Boom, PharmD, BCPS [phone] 12/30/2018, 10:20 AM

## 2018-12-31 LAB — CBC
HCT: 24.3 % — ABNORMAL LOW (ref 36.0–46.0)
Hemoglobin: 7.5 g/dL — ABNORMAL LOW (ref 12.0–15.0)
MCH: 28.6 pg (ref 26.0–34.0)
MCHC: 30.9 g/dL (ref 30.0–36.0)
MCV: 92.7 fL (ref 80.0–100.0)
Platelets: 194 10*3/uL (ref 150–400)
RBC: 2.62 MIL/uL — ABNORMAL LOW (ref 3.87–5.11)
RDW: 14.1 % (ref 11.5–15.5)
WBC: 10.4 10*3/uL (ref 4.0–10.5)
nRBC: 0 % (ref 0.0–0.2)

## 2018-12-31 LAB — BASIC METABOLIC PANEL
Anion gap: 7 (ref 5–15)
BUN: 15 mg/dL (ref 8–23)
CO2: 27 mmol/L (ref 22–32)
Calcium: 7.9 mg/dL — ABNORMAL LOW (ref 8.9–10.3)
Chloride: 102 mmol/L (ref 98–111)
Creatinine, Ser: 0.57 mg/dL (ref 0.44–1.00)
GFR calc Af Amer: >60 mL/min (ref >60)
GFR calc non Af Amer: >60 mL/min (ref >60)
Glucose, Bld: 139 mg/dL — ABNORMAL HIGH (ref 70–99)
Potassium: 3.9 mmol/L (ref 3.5–5.1)
Sodium: 136 mmol/L (ref 135–145)

## 2018-12-31 LAB — PROTIME-INR
INR: 1.6 — ABNORMAL HIGH (ref 0.8–1.2)
Prothrombin Time: 19.3 seconds — ABNORMAL HIGH (ref 11.4–15.2)

## 2018-12-31 LAB — SARS CORONAVIRUS 2 BY RT PCR (HOSPITAL ORDER, PERFORMED IN ~~LOC~~ HOSPITAL LAB): SARS Coronavirus 2: NEGATIVE

## 2018-12-31 MED ORDER — WARFARIN SODIUM 5 MG PO TABS
5.0000 mg | ORAL_TABLET | Freq: Once | ORAL | Status: AC
  Administered 2018-12-31: 5 mg via ORAL
  Filled 2018-12-31: qty 1

## 2018-12-31 MED ORDER — FERROUS SULFATE 325 (65 FE) MG PO TABS
325.0000 mg | ORAL_TABLET | Freq: Two times a day (BID) | ORAL | Status: DC
  Administered 2018-12-31 – 2019-01-01 (×2): 325 mg via ORAL
  Filled 2018-12-31 (×2): qty 1

## 2018-12-31 NOTE — Progress Notes (Signed)
[place] for Warfarin Indication: hx PE/DVT  Allergies  Allergen Reactions  . Lasix [Furosemide] Other (See Comments)    Blood pressure dropped   . Statins Other (See Comments)    Cramps in legs and arms  . Tape Other (See Comments)    Cannot use paper tape clear tape takes the skin off  . Penicillins Rash    Did it involve swelling of the face/tongue/throat, SOB, or low BP? No Did it involve sudden or severe rash/hives, skin peeling, or any reaction on the inside of your mouth or nose? No Did you need to seek medical attention at a hospital or doctor's office? No When did it last happen?30 years ago If all above answers are "NO", may proceed with cephalosporin use.   Patient Measurements: Height: 5\' 5"  (165.1 cm) Weight: 187 lb 4 oz (84.9 kg) IBW/kg (Calculated) : 57  Vital Signs: Temp: 98.6 F (37 C) (11/27 0344) Temp Source: Oral (11/27 0344) BP: 147/59 (11/27 0344) Pulse Rate: 110 (11/27 0344)  Labs: Recent Labs    12/29/18 0555 12/30/18 0243 12/31/18 0303  HGB  --  8.1* 7.5*  HCT  --  26.0* 24.3*  PLT  --  201 194  LABPROT 15.4* 16.1* 19.3*  INR 1.2 1.3* 1.6*  CREATININE  --  0.50 0.57   Estimated Creatinine Clearance: 54.3 mL/min (by C-G formula based on SCr of 0.57 mg/dL).   Assessment: 77 yoF s/p R total hip revision on 12/29/2018. Pharmacy consulted to resume warfarin post-op.   Baseline INR low as expected  Prior anticoagulation: warfarin 5mg  PO daily, LD 11/20   Today, 12/31/2018:  CBC: Hgb dropped 12.1 > 8.1 > 7.5 post-op --> currently asymptomatic per PA-C. Pltc remains WNL  INR still subtherapeutic but trending up  Major drug interactions: none noted; on Lovenox ppx while INR low  No bleeding issues per nursing  Eating 100% of meals  Goal of Therapy: INR 2-3  Plan:  Warfarin 5mg  PO x 1 today   Enoxaparin 40mg  SQ q24h until INR =/> 2 per Ortho.   Daily PT/INR  CBC at least q72h  while on warfarin  Monitor for signs of bleeding or thrombosis   Lindell Spar, PharmD, BCPS Clinical Pharmacist  12/31/2018, 10:35 AM

## 2018-12-31 NOTE — Progress Notes (Signed)
Writer received a call from patient's daughter, verbalized that her brother is exposed with covid, and going for testing. Also patient's daughter also exposed and getting tested. She visited patient yesterday. PA is paged and recommended rapid covid testing. Daughter is called and made aware.

## 2018-12-31 NOTE — Plan of Care (Signed)
  Problem: Activity: Goal: Risk for activity intolerance will decrease Outcome: Progressing   Problem: Nutrition: Goal: Adequate nutrition will be maintained Outcome: Progressing   Problem: Pain Managment: Goal: General experience of comfort will improve Outcome: Progressing   Problem: Activity: Goal: Ability to tolerate increased activity will improve Outcome: Progressing   

## 2018-12-31 NOTE — Progress Notes (Addendum)
Subjective: 2 Days Post-Op Procedure(s) (LRB): TOTAL HIP REVISION (Right) Patient reports pain as mild.   +ambulation with PT Tolerating Po w/o N/V +void +flatus, -BM Denies Calf pain, CP, SOB, dizziness/lightheadedness Is concerned about going home today as her son was recently exposed to [place]. Plan was for her to D/C home with homehealth and family assistance from daughter and son. She is in communication with family, she still thinks she will have enough help at home; her son is getting COVID tested.   Objective: Vital signs in last 24 hours: Temp:  [98.4 F (36.9 C)-98.8 F (37.1 C)] 98.6 F (37 C) (11/27 0344) Pulse Rate:  [98-118] 110 (11/27 0344) Resp:  [16-18] 18 (11/27 0344) BP: (127-147)/(59-72) 147/59 (11/27 0344) SpO2:  [91 %-99 %] 91 % (11/27 0344)  Intake/Output from previous day: 11/26 0701 - 11/27 0700 In: 713.1 [P.O.:600; I.V.:113.1] Out: 700 [Urine:700] Intake/Output this shift: No intake/output data recorded.  Recent Labs    12/30/18 0243 12/31/18 0303  HGB 8.1* 7.5*   Recent Labs    12/30/18 0243 12/31/18 0303  WBC 8.4 10.4  RBC 2.84* 2.62*  HCT 26.0* 24.3*  PLT 201 194   Recent Labs    12/30/18 0243 12/31/18 0303  NA 136 136  K 3.8 3.9  CL 102 102  CO2 27 27  BUN 15 15  CREATININE 0.50 0.57  GLUCOSE 135* 139*  CALCIUM 7.9* 7.9*   Recent Labs    12/30/18 0243 12/31/18 0303  INR 1.3* 1.6*    Neurologically intact ABD soft Neurovascular intact Sensation intact distally Intact pulses distally Dorsiflexion/Plantar flexion intact Incision: dressing C/D/I and scant drainage No cellulitis present Compartment soft Calf's non-swollen, mildly tender bilaterally, no discoloration or palpable cords. Negative homan's bilaterally.  Assessment/Plan: 2 Days Post-Op Procedure(s) (LRB): TOTAL HIP REVISION (Right) Advance diet Up with therapy Plan for discharge tomorrow. Patient will still likely be able to D/C home with [facility] and family  support, but given the patient's son's recent COVID exposure she is requesting more time to discuss with family prior to D/C. This will also give [place] another day to monitor for symptoms of low Hgb/Hct and INR prior to D/C.  DVT PPx: Lovenox and coumadin per pharmacy WBAT Dressing change ordered.  Low Hgb: To be expected post-op, patient is currently asymptomatic. I did briefly discuss with the patient that her blood count is low after surgery and that if she becomes symptomatic that the recommendation would be to transfuse 1 unit. At this time she is asymptomatic. Addendum: We will also supplement iron 325 BID.  Addendum #2: I was contacted by RN who spoke with daughter. Although patient was COVID negative on 11/21, patient was exposed to her son after this test who is now ?COIVD positive versus COVID exposed? following this negative test. They are requesting a repeat COVID test prior to D/C which I do think is reasonable for safety of staff as well as family assisting the patient post-operatively.   Yvonne Kendall Ward 12/31/2018, 9:04 AM

## 2018-12-31 NOTE — Progress Notes (Signed)
Physical Therapy Treatment Patient Details Name: Makayla Schultz MRN: [id number] DOB: 1930/10/17 Today's Date: 12/31/2018    History of Present Illness Patient is 85 y.o. female s/p Rt THR posterior approach with PMH significant for pulmonary HTN, neuropathy, HLD, dyspnea, asthma, and OA.    PT Comments    Patient is POD 2 s/p Rt THR. Patient was able to verbalize 2/3 posterior hip precautions and required cues during transfers to maintain precaution and deter hip flexion past 90 degrees. Patient demonstrated improved ability to perform sit<>stand transfers with good carryover for safe hand placement and no assist required to initiate power up. Patient increased distance for ambulation with min guard assist at start and min assist with cues for posture as she fatigued. Pt instructed on seated exercise for HEP and educated on importance of continuing to perform exercises throughout the day. She will continue to benefit from skilled PT interventions to address impairments and progress towards goals.   Patient's discharge plan has been complicated by her son and daughter having a possible exposure to COVID-19 and now requiring testing. Her son and duagther were both planning to assist her mother upon discharge home and they are now trying to find other arrangements and are working with a neighbor who may be able to help their mother.   Follow Up Recommendations  Follow surgeon's recommendation for DC plan and follow-up therapies     Equipment Recommendations  None recommended by PT    Recommendations for Other Services       Precautions / Restrictions Precautions Precautions: Fall;Posterior Hip Restrictions Weight Bearing Restrictions: No    Mobility  Bed Mobility               General bed mobility comments: pt OOB at start of session and ended in recliner  Transfers Overall transfer level: Needs assistance Equipment used: Rolling walker (2 wheeled) Transfers: Sit to/from  Stand Sit to Stand: Min guard;From elevated surface         General transfer comment: Patient with improved carryover for hand placement on RW and technique to initiate power up. She was able to perform power up and rise with min guard asssit from recliner.  Ambulation/Gait Ambulation/Gait assistance: Min assist;Min guard Gait Distance (Feet): 180 Feet Assistive device: Rolling walker (2 wheeled) Gait Pattern/deviations: Step-to pattern;Decreased stance time - right;Decreased stride length;Decreased weight shift to right Gait velocity: decreased   General Gait Details: pt continues to mobilize slowly; she demonstrates safe sequencing with RW, continues to have antalgic gait and decreased weight shift to Rt LE. increased assist reqruired as pt fatigued, cues for posture.   Stairs             Wheelchair Mobility    Modified Rankin (Stroke Patients Only)       Balance Overall balance assessment: Needs assistance Sitting-balance support: No upper extremity supported;Feet supported Sitting balance-Leahy Scale: Fair     Standing balance support: During functional activity;Bilateral upper extremity supported Standing balance-Leahy Scale: Poor         Cognition Arousal/Alertness: Awake/alert Behavior During Therapy: WFL for tasks assessed/performed Overall Cognitive Status: Within Functional Limits for tasks assessed       Exercises Total Joint Exercises Ankle Circles/Pumps: AROM;15 reps;Seated;Both Quad Sets: AROM;10 reps;Seated;Right Short Arc Quad: AROM;10 reps;Seated;Right Heel Slides: AROM;10 reps;Right;Seated(tactile/verbal cue to deter hip flexion past 90 degrees) Hip ABduction/ADduction: AROM;10 reps;Right;Seated Long Arc Quad: AROM;10 reps;Seated    General Comments        Pertinent Vitals/Pain Pain Assessment: 0-10 Pain  Score: 5  Pain Location: Rt hip Pain Descriptors / Indicators: Aching;Sore;Guarding Pain Intervention(s): Limited activity within  patient's tolerance;Monitored during session;Repositioned;Ice applied           PT Goals (current goals can now be found in the care plan section) Acute Rehab PT Goals Patient Stated Goal: to return home and be able to walk better PT Goal Formulation: With patient Time For Goal Achievement: 01/05/19 Potential to Achieve Goals: Good    Frequency    7X/week      PT Plan         AM-PAC PT "6 Clicks" Mobility   Outcome Measure  Help needed turning from your back to your side while in a flat bed without using bedrails?: A Little Help needed moving from lying on your back to sitting on the side of a flat bed without using bedrails?: A Little Help needed moving to and from a bed to a chair (including a wheelchair)?: A Little Help needed standing up from a chair using your arms (e.g., wheelchair or bedside chair)?: A Little Help needed to walk in hospital room?: A Little Help needed climbing 3-5 steps with a railing? : A Lot 6 Click Score: 17    End of Session Equipment Utilized During Treatment: Gait belt Activity Tolerance: Patient tolerated treatment well Patient left: in chair;with call bell/phone within reach;with chair alarm set Nurse Communication: Mobility status PT Visit Diagnosis: Muscle weakness (generalized) (M62.81);Difficulty in walking, not elsewhere classified (R26.2)     Time: 7106-2694 PT Time Calculation (min) (ACUTE ONLY): 33 min  Charges:  $Gait Training: 8-22 mins $Therapeutic Exercise: 8-22 mins                     Kipp Brood, PT, DPT Physical Therapist with [facility]  12/31/2018 11:56 AM

## 2018-12-31 NOTE — Progress Notes (Signed)
Pt is transferred to 1526 with all her belongings in stable condition. Family made aware.

## 2019-01-01 LAB — PROTIME-INR
INR: 2 — ABNORMAL HIGH (ref 0.8–1.2)
Prothrombin Time: 22.9 seconds — ABNORMAL HIGH (ref 11.4–15.2)

## 2019-01-01 LAB — CBC
HCT: 25.6 % — ABNORMAL LOW (ref 36.0–46.0)
Hemoglobin: 7.5 g/dL — ABNORMAL LOW (ref 12.0–15.0)
MCH: 28 pg (ref 26.0–34.0)
MCHC: 29.3 g/dL — ABNORMAL LOW (ref 30.0–36.0)
MCV: 95.5 fL (ref 80.0–100.0)
Platelets: 215 10*3/uL (ref 150–400)
RBC: 2.68 MIL/uL — ABNORMAL LOW (ref 3.87–5.11)
RDW: 14.2 % (ref 11.5–15.5)
WBC: 8.3 10*3/uL (ref 4.0–10.5)
nRBC: 0 % (ref 0.0–0.2)

## 2019-01-01 MED ORDER — FERROUS SULFATE 325 (65 FE) MG PO TABS: 325.0000 mg | ORAL_TABLET | Freq: Two times a day (BID) | ORAL | 0 refills | Status: AC

## 2019-01-01 NOTE — Plan of Care (Signed)
Plan of care reviewed and discussed with the patient. 

## 2019-01-01 NOTE — Progress Notes (Signed)
Brief Pharmacy Note  INR therapeutic today (2.0) on home warfarin dose 5mg  daily. OK to discontinue Lovenox injections as discussed with Theresa Duty, PA Discharge orders updated.  Netta Cedars, PharmD, BCPS 01/01/2019@9 :35 AM

## 2019-01-01 NOTE — Progress Notes (Signed)
Physical Therapy Treatment Patient Details Name: Makayla Schultz MRN: [id number] DOB: 05/16/1930 Today's Date: 01/01/2019    History of Present Illness Patient is 85 y.o. female s/p Rt THR posterior approach with PMH significant for pulmonary HTN, neuropathy, HLD, dyspnea, asthma, and OA.    PT Comments    Pt progressing although fatigued quickly with gait today. Will see for second session for HEP review.  Follow Up Recommendations  Follow surgeon's recommendation for DC plan and follow-up therapies     Equipment Recommendations  None recommended by PT    Recommendations for Other Services       Precautions / Restrictions Precautions Precautions: Fall;Posterior Hip Precaution Comments: reviewed posterior THP,  pt recalls 2 of 3 Restrictions Weight Bearing Restrictions: No    Mobility  Bed Mobility Overal bed mobility: Needs Assistance Bed Mobility: Supine to Sit     Supine to sit: Min assist     General bed mobility comments: assist to initiate movement LEs, assist to safely elevate trunk  Transfers Overall transfer level: Needs assistance Equipment used: Rolling walker (2 wheeled) Transfers: Sit to/from Stand Sit to Stand: Min guard         General transfer comment: cues for hand placement and RLE position, subtle cues,  pt demo's carryover from  previous session  Ambulation/Gait Ambulation/Gait assistance: Min guard Gait Distance (Feet): 70 Feet Assistive device: Rolling walker (2 wheeled) Gait Pattern/deviations: Step-to pattern;Decreased stance time - right;Decreased stride length;Decreased weight shift to right     General Gait Details: pt fatigues quickly today, limited gait distance but goo dcarryover with avoiding twisting RLE with turns   Social research officer, government Rank[place] (Stroke Patients Only)       Balance                                            Cognition Arousal/Alertness:  Awake/alert Behavior During Therapy: WFL for tasks assessed/performed Overall Cognitive Status: Within Functional Limits for tasks assessed                                        Exercises      General Comments        Pertinent Vitals/Pain Pain Assessment: 0-10 Pain Score: 4  Pain Location: "all over" Pain Descriptors / Indicators: Grimacing;Sore Pain Intervention(s): Limited activity within patient's tolerance;Monitored during session;Premedicated before session;Repositioned    Home Living                      Prior Function            PT Goals (current goals can now be found in the care plan section) Acute Rehab PT Goals Patient Stated Goal: to return home and be able to walk better PT Goal Formulation: With patient Time For Goal Achievement: 01/05/19 Potential to Achieve Goals: Good Progress towards PT goals: Progressing toward goals    Frequency    7X/week      PT Plan Current plan remains appropriate    Co-evaluation              AM-PAC PT "6 Clicks" Mobility   Outcome Measure  Help needed turning from your back to your side  while in a flat bed without using bedrails?: A Little Help needed moving from lying on your back to sitting on the side of a flat bed without using bedrails?: A Little Help needed moving to and from a bed to a chair (including a wheelchair)?: A Little Help needed standing up from a chair using your arms (e.g., wheelchair or bedside chair)?: A Little Help needed to walk in hospital room?: A Little Help needed climbing 3-5 steps with a railing? : A Little 6 Click Score: 18    End of Session Equipment Utilized During Treatment: Gait belt Activity Tolerance: Patient tolerated treatment well Patient left: in chair;with call bell/phone within reach Nurse Communication: Mobility status PT Visit Diagnosis: Muscle weakness (generalized) (M62.81);Difficulty in walking, not elsewhere classified (R26.2)      Time: 8937-3428 PT Time Calculation (min) (ACUTE ONLY): 17 min  Charges:  $Gait Training: 8-22 mins                     Kenyon Ana, PT  Pager: [phone] Acute Rehab Dept [facility]): [phone]   01/01/2019    [facility] 01/01/2019, 10:50 AM

## 2019-01-01 NOTE — Progress Notes (Signed)
Home health agencies that serve [phone].        [place] Quality of Patient Care Rating Patient Survey Summary Rating  [facility] [phone] 3  out of 5 stars 5 out of [place] [phone] 4 out of 5 stars 4 out of [place] [phone] 5 out of 5 stars 3 out of [place] [phone] 4  out of 5 stars 5 out of [place] [phone] 4 out of 5 stars 4 out of [place] [phone] 4 out of 5 stars 4 out of [place] [phone] 4  out of 5 stars 4 out of [place] [phone] 3 out of 5 stars 4 out of [place] [phone] 2  out of 5 stars 3 out of [place] [phone] 3 out of 5 stars Not Available9  [facility] [phone] Not Available4 Not Available12  [facility] (336) [phone] 4  out of 5 stars 3 out of 5 [place] [phone] 3 out of 5 stars Not Available9  [facility] [phone] 3  out of 5 stars 4 out of [place] [phone] 4 out of 5 stars 5 out of [place] [phone] 3  out of 5 stars 4 out of [place] [phone] 3  out of 5 stars 4 out of [place] [phone] 4  out of 5 stars Not [place] [phone] 3  out of 5 stars 5 out of [place] [phone] 4  out of 5 stars 4 out of [place] [phone] 4  out of 5 stars 5 out of [place] number Footnote as displayed on [place]  1  This agency provides services under a federal waiver program to non-traditional, chronic long term population.  2 This agency provides services to a special needs population.  3 Not Available.  4 The number of patient episodes for this measure is too small to report.  5 This measure currently does not have data or provider has been certified/recertified for less than 6 months.  6 The national average for this measure is not provided because of state-to-state differences in data collection.  7 Medicare is not displaying rates for this measure for any home health agency, because of an issue with the data.  8 There were problems with the data and they are being corrected.  9 Zero, or very few, patients met the survey's rules for inclusion. The scores shown, if any, reflect a very small number of surveys and may not accurately tell how an agency is doing.  10 Survey results are based on less than 12 months of data.  11 Fewer than 70 patients completed the survey. Use the scores shown, if any, with caution as the number of surveys may be  too low to accurately tell how an agency is doing.  12 No survey results are available for this period.  13 Data suppressed by CMS for one or more quarters.

## 2019-01-01 NOTE — Progress Notes (Signed)
   Subjective: 3 Days Post-Op Procedure(s) (LRB): TOTAL HIP REVISION (Right) Patient reports pain as mild.   Patient seen in rounds for Dr. Wynelle Link. Patient is well, and has had no acute complaints or problems other than soreness in the right hip. Denies chest pain or SOB. No issues overnight. Voiding without difficulty and positive flatus.  Plan is to go Home after hospital stay.  Objective: Vital signs in last 24 hours: Temp:  [98.4 F (36.9 C)-99.6 F (37.6 C)] 99 F (37.2 C) (11/28 0539) Pulse Rate:  [78-113] 86 (11/28 0539) Resp:  [16] 16 (11/28 0539) BP: (118-123)/(53-65) 118/53 (11/28 0539) SpO2:  [94 %-99 %] 94 % (11/28 0539)  Intake/Output from previous day:  Intake/Output Summary (Last 24 hours) at 01/01/2019 0806 Last data filed at 01/01/2019 0539 Gross per 24 hour  Intake 780 ml  Output 0 ml  Net 780 ml    Labs: Recent Labs    12/30/18 0243 12/31/18 0303 01/01/19 0301  HGB 8.1* 7.5* 7.5*   Recent Labs    12/31/18 0303 01/01/19 0301  WBC 10.4 8.3  RBC 2.62* 2.68*  HCT 24.3* 25.6*  PLT 194 215   Recent Labs    12/30/18 0243 12/31/18 0303  NA 136 136  K 3.8 3.9  CL 102 102  CO2 27 27  BUN 15 15  CREATININE 0.50 0.57  GLUCOSE 135* 139*  CALCIUM 7.9* 7.9*   Recent Labs    12/31/18 0303 01/01/19 0301  INR 1.6* 2.0*    Exam: General - Patient is Alert and Oriented Extremity - Neurologically intact Neurovascular intact Sensation intact distally Dorsiflexion/Plantar flexion intact Dressing/Incision - clean, dry, no drainage Motor Function - intact, moving foot and toes well on exam.   Past Medical History:  Diagnosis Date  . Anemia   . Antiphospholipid syndrome (HCC)    Dr. Marveen Reeks  . Arthritis   . Asthma   . Chronic venous insufficiency   . Dyspnea   . Dysrhythmia    PVC  . Fatigue   . History of blood clots    Left lower leg  . History of colon polyps   . Hyperlipidemia   . 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 Neuropathy    . Post-thrombotic syndrome   . Pulmonary hypertension ([place])   . PVC (premature ventricular contraction)   . Venous ulcer of ankle, left (HCC)     Assessment/Plan: 3 Days Post-Op Procedure(s) (LRB): TOTAL HIP REVISION (Right) Principal Problem:   Failed total hip arthroplasty (HCC)  Estimated body mass index is 31.16 kg/m as calculated from the following:   Height as of this encounter: 5\' 5"  (1.651 m).   Weight as of this encounter: 84.9 kg. Up with therapy  DVT Prophylaxis - Lovenox and Coumadin Weight-bearing as tolerated  Hemoglobin stable at 7.5. Sending home with ferrous sulfate 325 mg BID x 3 weeks.  Covid-19 rapid test came back negative. Spoke with patient, both daughter and son's Covid test came back negative as well.   Ready for discharge to home with HHPT. Follow-up in the office in 2 weeks.   Theresa Duty, PA-C Orthopedic Surgery 01/01/2019, 8:06 AM

## 2019-01-01 NOTE — TOC Progression Note (Signed)
Transition of Care [facility]) - Progression Note    Patient Details  Name: Amorette Charrette MRN: [id number] Date of Birth: Mar 28, 1930  [facility] CM/SW Contact  Joaquin Courts, RN Phone Number: 01/01/2019, 10:16 AM  Clinical Narrative:    CM spoke with patient. [facility] with [facility] for HHPT. Reports has rolling walker and 3-in-1 at home.   Expected Discharge Plan: [place] Barriers to Discharge: No Barriers Identified  Expected Discharge Plan and Services Expected Discharge Plan: [place]   Discharge Planning Services: CM Consult Post Acute Care Choice: [place] arrangements for the past 2 months: Single Family Home Expected Discharge Date: 01/01/19               DME Arranged: N/A DME Agency: NA       HH Arranged: PT HH Agency: [facility] Date [place]: 01/01/19 Time [place]: [place] Representative spoke with at [place]: [place]   Social Determinants of Health ([place]) Interventions    Readmission Risk Interventions No flowsheet data found.

## 2019-01-01 NOTE — Progress Notes (Signed)
   01/01/19 1200  PT Visit Information  Last PT Received On 01/01/19  Pt progressing well. Exercise focused session,reviewed THP. Pt tol HEP well  Assistance Needed +1  History of Present Illness Patient is 85 y.o. female s/p Rt THR posterior approach with PMH significant for pulmonary HTN, neuropathy, HLD, dyspnea, asthma, and OA.  Subjective Data  Patient Stated Goal to return home and be able to walk better  Precautions  Precautions Fall;Posterior Hip  Precaution Comments reviewed posterior THP,  pt recalls 2 of 3  Pain Assessment  Pain Assessment 0-10  Pain Score 4  Pain Location "all over"  Pain Descriptors / Indicators Grimacing;Sore  Pain Intervention(s) Limited activity within patient's tolerance;Monitored during session;Ice applied  Cognition  Arousal/Alertness Awake/alert  Behavior During Therapy WFL for tasks assessed/performed  Overall Cognitive Status Within Functional Limits for tasks assessed  Bed Mobility  General bed mobility comments pt in chair   Total Joint Exercises  Ankle Circles/Pumps AROM;15 reps;Both  Quad Sets AROM;Both;10 reps  [place] [employer];Right;10 reps  Heel Slides AAROM;Right;10 reps;AROM  Hip ABduction/ADduction AROM;AAROM;Right;10 reps;Other (comment) (last 5 reps painful, decr ROM to lessen pain)  PT - End of Session  Activity Tolerance Patient tolerated treatment well  Patient left in chair;with call bell/phone within reach  Nurse Communication Mobility status   PT - Assessment/Plan  PT Plan Current plan remains appropriate  PT Visit Diagnosis Muscle weakness (generalized) (M62.81);Difficulty in walking, not elsewhere classified (R26.2)  PT Frequency (ACUTE ONLY) 7X/week  Follow Up Recommendations Follow surgeon's recommendation for DC plan and follow-up therapies  PT equipment None recommended by PT  AM-PAC PT "6 Clicks" Mobility Outcome Measure (Version 2)  Help needed turning from your back to your side while in a flat bed  without using bedrails? 3  Help needed moving from lying on your back to sitting on the side of a flat bed without using bedrails? 3  Help needed moving to and from a bed to a chair (including a wheelchair)? 3  Help needed standing up from a chair using your arms (e.g., wheelchair or bedside chair)? 3  Help needed to walk in hospital room? 3  Help needed climbing 3-5 steps with a railing?  3  6 Click Score 18  Consider Recommendation of Discharge To: Home with [facility]  PT Goal Progression  Progress towards PT goals Progressing toward goals  Acute Rehab PT Goals  PT Goal Formulation With patient  Time For Goal Achievement 01/05/19  Potential to Achieve Goals Good  PT Time Calculation  PT Start Time (ACUTE ONLY) 1201  PT Stop Time (ACUTE ONLY) 1219  PT Time Calculation (min) (ACUTE ONLY) 18 min  PT General Charges  $$ ACUTE PT VISIT 1 Visit  PT Treatments  $Therapeutic Exercise 8-22 mins

## 2019-01-01 NOTE — Plan of Care (Signed)
Patient discharged home in stable condition 

## 2019-01-02 LAB — TYPE AND SCREEN
ABO/RH(D): A POS
Antibody Screen: NEGATIVE
Unit division: 0
Unit division: 0

## 2019-01-02 LAB — BPAM RBC
Blood Product Expiration Date: 202012072359
Blood Product Expiration Date: 202012182359
Unit Type and Rh: 6200
Unit Type and Rh: 6200

## 2019-01-03 ENCOUNTER — Encounter (HOSPITAL_COMMUNITY): Payer: Self-pay | Admitting: Orthopedic Surgery

## 2019-01-03 NOTE — Discharge Summary (Signed)
Physician Discharge Summary   Patient ID: Makayla Schultz MRN: [id number] DOB/AGE: 09-26-1930 85 y.o.  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 Anemia   . Antiphospholipid syndrome (HCC)    Dr. Sharee Pimple  . Arthritis   . Asthma   . Chronic venous insufficiency   . Dyspnea   . Dysrhythmia    PVC  . Fatigue   . History of blood clots    Left lower leg  . History of colon polyps   . Hyperlipidemia   . 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 Neuropathy   . Post-thrombotic syndrome   . Pulmonary hypertension (HCC)   . PVC (premature ventricular contraction)   . Venous ulcer of ankle, left [facility])    Discharge Diagnoses:   Principal Problem:   Failed total hip arthroplasty (HCC)  Estimated body mass index is 31.16 kg/m as calculated from the following:   Height as of this encounter:  (1.651 m).   Weight as of this encounter: 84.9 kg.  Procedure:  Procedure(s) (LRB): TOTAL HIP REVISION (Right)   Consults: None  HPI: The patient is an 85 year old female who had a right total hip arthroplasty done in approximately 2003.  She has had progressively worsening thigh pain over the past year.  X-rays in the office from 2 months ago showed that she had a grossly loose cemented femoral stem.  She presents now for right total hip arthroplasty revision.  Laboratory Data: Admission on 12/29/2018, Discharged on 01/01/2019  Component Date Value Ref Range Status  . Prothrombin Time 12/29/2018 15.4* 11.4 - 15.2 seconds Final  . INR 12/29/2018 1.2  0.8 - 1.2 Final   Comment: (NOTE) INR goal varies based on device and disease states. Performed at [facility], [address] [place], [place] [address]   . Order Confirmation 12/29/2018    Final                   Value:ORDER PROCESSED BY BLOOD BANK  Performed at [facility], [address], [place], [place] [address]   . WBC 12/30/2018 8.4  4.0 - 10.5 K/uL Final  . RBC 12/30/2018 2.84* 3.87 - 5.11 MIL/uL Final  . Hemoglobin 12/30/2018 8.1* 12.0 - 15.0 g/dL Final  . HCT 82/95/6213 26.0* 36.0 - 46.0 % Final  . MCV 12/30/2018 91.5  80.0 - 100.0 fL Final  . MCH 12/30/2018 28.5  26.0 - 34.0 pg Final  . MCHC 12/30/2018 31.2  30.0 - 36.0 g/dL Final  . RDW 08/65/7846 13.9  11.5 - 15.5 % Final  . Platelets 12/30/2018 201  150 - 400 K/uL Final  . nRBC 12/30/2018 0.0  0.0 - 0.2 % Final   Performed at [facility], [address] [place], [place] [address]  . Sodium 12/30/2018 136  135 - 145 mmol/L Final  . Potassium 12/30/2018 3.8  3.5 - 5.1 mmol/L Final  . Chloride 12/30/2018 102  98 - 111 mmol/L Final  . CO2 12/30/2018 27  22 - 32 mmol/L Final  . Glucose, Bld 12/30/2018 135* 70 - 99 mg/dL Final  . BUN 28/41/3244 15  8 - 23 mg/dL Final  . Creatinine, Ser 12/30/2018 0.50  0.44 - 1.00 mg/dL Final  . Calcium 02/05/7251 7.9* 8.9 - 10.3 mg/dL Final  . GFR calc non Af Amer 12/30/2018 >  60  >60 mL/min Final  . GFR calc Af Amer 12/30/2018 >60  >60 mL/min Final  . Anion gap 12/30/2018 7  5 - 15 Final   Performed at [facility], [address] [place], [place] [address]  . Prothrombin Time 12/30/2018 16.1* 11.4 - 15.2 seconds Final  . INR 12/30/2018 1.3* 0.8 - 1.2 Final   Comment: (NOTE) INR goal varies based on device and disease states. Performed at [facility], [address] [place], [place] [address]   . WBC 12/31/2018 10.4  4.0 - 10.5 K/uL Final  . RBC 12/31/2018 2.62* 3.87 - 5.11 MIL/uL Final  . Hemoglobin 12/31/2018 7.5* 12.0 - 15.0 g/dL Final  . HCT 09/81/1914 24.3* 36.0 - 46.0 % Final  . MCV 12/31/2018 92.7  80.0 - 100.0 fL Final  . MCH 12/31/2018 28.6  26.0 - 34.0 pg Final  . MCHC 12/31/2018 30.9  30.0 - 36.0 g/dL Final  . RDW 78/29/5621 14.1  11.5 -  15.5 % Final  . Platelets 12/31/2018 194  150 - 400 K/uL Final  . nRBC 12/31/2018 0.0  0.0 - 0.2 % Final   Performed at [facility], [address] [place], [place] [address]  . Sodium 12/31/2018 136  135 - 145 mmol/L Final  . Potassium 12/31/2018 3.9  3.5 - 5.1 mmol/L Final  . Chloride 12/31/2018 102  98 - 111 mmol/L Final  . CO2 12/31/2018 27  22 - 32 mmol/L Final  . Glucose, Bld 12/31/2018 139* 70 - 99 mg/dL Final  . BUN 78/46/9629 15  8 - 23 mg/dL Final  . Creatinine, Ser 12/31/2018 0.57  0.44 - 1.00 mg/dL Final  . Calcium 52/84/1324 7.9* 8.9 - 10.3 mg/dL Final  . GFR calc non Af Amer 12/31/2018 >60  >60 mL/min Final  . GFR calc Af Amer 12/31/2018 >60  >60 mL/min Final  . Anion gap 12/31/2018 7  5 - 15 Final   Performed at [facility], [address] [place], [place] [address]  . Prothrombin Time 12/31/2018 19.3* 11.4 - 15.2 seconds Final  . INR 12/31/2018 1.6* 0.8 - 1.2 Final   Comment: (NOTE) INR goal varies based on device and disease states. Performed at [facility], [address], [place], [place] [address]   . SARS Coronavirus 2 12/31/2018 NEGATIVE  NEGATIVE Final   Comment: (NOTE) SARS-CoV-2 target nucleic acids are NOT DETECTED. The SARS-CoV-2 RNA is generally detectable in upper and lower respiratory specimens during the acute phase of infection. The lowest concentration of SARS-CoV-2 viral copies this assay can detect is 250 copies / mL. A negative result does not preclude SARS-CoV-2 infection and should not be used as the sole basis for treatment or other patient management decisions.  A negative result may occur with improper specimen collection / handling, submission of specimen other than nasopharyngeal swab, presence of viral mutation(s) within the areas targeted by this assay, and inadequate number of viral copies (<250 copies / mL). A negative result must be combined with clinical observations,  patient history, and epidemiological information. Fact Sheet for Patients:   BoilerBrush.com.cy Fact Sheet for Healthcare Providers: https://pope.com/ This test is not yet approved or cleared                           by the [place] FDA and has been authorized for detection and/or diagnosis of SARS-CoV-2 by FDA under an Emergency  Use Authorization (EUA).  This EUA will remain in effect (meaning this test can be used) for the duration of the COVID-19 declaration under Section 564(b)(1) of the Act, 21 U.S.C. section 360bbb-3(b)(1), unless the authorization is terminated or revoked sooner. Performed at [facility], [address], [place], [place] [address]   . WBC 01/01/2019 8.3  4.0 - 10.5 K/uL Final  . RBC 01/01/2019 2.68* 3.87 - 5.11 MIL/uL Final  . Hemoglobin 01/01/2019 7.5* 12.0 - 15.0 g/dL Final  . HCT 19/14/7829 25.6* 36.0 - 46.0 % Final  . MCV 01/01/2019 95.5  80.0 - 100.0 fL Final  . MCH 01/01/2019 28.0  26.0 - 34.0 pg Final  . MCHC 01/01/2019 29.3* 30.0 - 36.0 g/dL Final  . RDW 56/21/3086 14.2  11.5 - 15.5 % Final  . Platelets 01/01/2019 215  150 - 400 K/uL Final  . nRBC 01/01/2019 0.0  0.0 - 0.2 % Final   Performed at [facility], [address] [place], [place] [address]  . Prothrombin Time 01/01/2019 22.9* 11.4 - 15.2 seconds Final  . INR 01/01/2019 2.0* 0.8 - 1.2 Final   Comment: (NOTE) INR goal varies based on device and disease states. Performed at [facility], [address] [place], [place] [address]   Hospital Outpatient Visit on 12/27/2018  Component Date Value Ref Range Status  . aPTT 12/27/2018 36  24 - 36 seconds Final   Performed at [facility], [address], [place], [place] [address]  . WBC 12/27/2018 6.1  4.0 - 10.5 K/uL Final  . RBC 12/27/2018 4.32  3.87 - 5.11 MIL/uL Final  . Hemoglobin 12/27/2018 12.1  12.0 - 15.0  g/dL Final  . HCT 24/40/1027 39.7  36.0 - 46.0 % Final  . MCV 12/27/2018 91.9  80.0 - 100.0 fL Final  . MCH 12/27/2018 28.0  26.0 - 34.0 pg Final  . MCHC 12/27/2018 30.5  30.0 - 36.0 g/dL Final  . RDW 25/36/6440 14.2  11.5 - 15.5 % Final  . Platelets 12/27/2018 276  150 - 400 K/uL Final  . nRBC 12/27/2018 0.0  0.0 - 0.2 % Final  . Neutrophils Relative % 12/27/2018 64  % Final  . Neutro Abs 12/27/2018 3.9  1.7 - 7.7 K/uL Final  . Lymphocytes Relative 12/27/2018 21  % Final  . Lymphs Abs 12/27/2018 1.3  0.7 - 4.0 K/uL Final  . Monocytes Relative 12/27/2018 12  % Final  . Monocytes Absolute 12/27/2018 0.7  0.1 - 1.0 K/uL Final  . Eosinophils Relative 12/27/2018 2  % Final  . Eosinophils Absolute 12/27/2018 0.1  0.0 - 0.5 K/uL Final  . Basophils Relative 12/27/2018 1  % Final  . Basophils Absolute 12/27/2018 0.0  0.0 - 0.1 K/uL Final  . Immature Granulocytes 12/27/2018 0  % Final  . Abs Immature Granulocytes 12/27/2018 0.01  0.00 - 0.07 K/uL Final   Performed at [facility], [address], [place], [place] [address]  . Sodium 12/27/2018 138  135 - 145 mmol/L Final  . Potassium 12/27/2018 4.0  3.5 - 5.1 mmol/L Final  . Chloride 12/27/2018 102  98 - 111 mmol/L Final  . CO2 12/27/2018 26  22 - 32 mmol/L Final  . Glucose, Bld 12/27/2018 92  70 - 99 mg/dL Final  . BUN 59/56/3875 18  8 - 23 mg/dL Final  . Creatinine, Ser 12/27/2018 0.60  0.44 - 1.00 mg/dL Final  . Calcium 64/33/2951 9.1  8.9 - 10.3 mg/dL Final  . Total Protein 12/27/2018 7.1  6.5 - 8.1 g/dL Final  . Albumin 29/52/8413 4.0  3.5 - 5.0 g/dL Final  . AST 24/40/1027 19  15 - 41 U/L Final  . ALT 12/27/2018 13  0 - 44 U/L Final  . Alkaline Phosphatase 12/27/2018 94  38 - 126 U/L Final  . Total Bilirubin 12/27/2018 0.4  0.3 - 1.2 mg/dL Final  . GFR calc non Af Amer 12/27/2018 >60  >60 mL/min Final  . GFR calc Af Amer 12/27/2018 >60  >60 mL/min Final  . Anion gap 12/27/2018 10  5 - 15 Final   Performed at  [facility], [address] [place] [address], [place], [place] [address]  . Prothrombin Time 12/27/2018 21.1* 11.4 - 15.2 seconds Final  . INR 12/27/2018 1.8* 0.8 - 1.2 Final   Comment: (NOTE) INR goal varies based on device and disease states. Performed at [facility], [address], [place], [place] [address]   . ABO/RH(D) 12/27/2018 A POS   Final  . Antibody Screen 12/27/2018 NEG   Final  . Sample Expiration 12/27/2018 01/01/2019,2359   Final  . Extend sample reason 12/27/2018 NO TRANSFUSIONS OR PREGNANCY IN THE PAST 3 MONTHS   Final  . Unit Number 12/27/2018 [id number]   Final  . Blood Component Type 12/27/2018 RED CELLS,LR   Final  . Unit division 12/27/2018 00   Final  . Status of Unit 12/27/2018 REL FROM [facility]   Final  . Transfusion Status 12/27/2018 OK TO TRANSFUSE   Final  . Crossmatch Result 12/27/2018    Final                   Value:Compatible Performed at [facility], [address] [place], [place] [address]   . Unit Number 12/27/2018 [id number]   Final  . Blood Component Type 12/27/2018 RBC, LR IRR   Final  . Unit division 12/27/2018 00   Final  . Status of Unit 12/27/2018 REL FROM [facility]   Final  . Transfusion Status 12/27/2018 OK TO TRANSFUSE   Final  . Crossmatch Result 12/27/2018 Compatible   Final  . MRSA, PCR 12/27/2018 NEGATIVE  NEGATIVE Final  . Staphylococcus aureus 12/27/2018 NEGATIVE  NEGATIVE Final   Comment: (NOTE) The Xpert SA Assay (FDA approved for NASAL specimens in patients 23 years of age and older), is one component of a comprehensive surveillance program. It is not intended to diagnose infection nor to guide or monitor treatment. Performed at [facility], [address] [place], [place] [address]   . ABO/RH(D) 12/27/2018    Final                   Value:A POS Performed at [facility], [address], [place], [place] [address]   . Blood Product Unit  Number 12/27/2018 [id number]   Final  . PRODUCT CODE 12/27/2018 [id number]   Final  . Unit Type and Rh 12/27/2018 6200   Final  . Blood Product Expiration Date 12/27/2018 761607371062   Final  . Blood Product Unit Number 12/27/2018 [id number]   Final  . PRODUCT CODE 12/27/2018 [id number]   Final  . Unit Type and Rh 12/27/2018 6200   Final  . Blood Product Expiration Date 12/27/2018 [id number]   Final  Hospital Outpatient Visit on 12/25/2018  Component Date Value Ref Range Status  . SARS-CoV-2, NAA 12/25/2018 NOT DETECTED  NOT DETECTED Final  Comment: (NOTE) This nucleic acid amplification test was developed and its performance characteristics determined by [employer]. Nucleic acid amplification tests include PCR and TMA. This test has not been FDA cleared or approved. This test has been authorized by FDA under an Emergency Use Authorization (EUA). This test is only authorized for the duration of time the declaration that circumstances exist justifying the authorization of the emergency use of in vitro diagnostic tests for detection of SARS-CoV-2 virus and/or diagnosis of COVID-19 infection under section 564(b)(1) of the Act, 21 U.S.C. 161WRU-0(A) (1), unless the authorization is terminated or revoked sooner. When diagnostic testing is negative, the possibility of a false negative result should be considered in the context of a patient's recent exposures and the presence of clinical signs and symptoms consistent with COVID-19. An individual without symptoms of COVID- 19 and who is not shedding SARS-CoV-2 vi                          rus would expect to have a negative (not detected) result in this assay. Performed At: [facility] [address] [place], [place] [id number] Maurine Simmering MDPhD [id number]   . Coronavirus Source 12/25/2018 NASOPHARYNGEAL   Final   Performed at [facility], [address] [place], [place] [address]     X-Rays:Dg Pelvis  Portable  Result Date: 12/29/2018 CLINICAL DATA:  Right hip replacement. EXAM: PORTABLE PELVIS 1-2 VIEWS COMPARISON:  12/29/2018 at 0925 hours. FINDINGS: Right total hip arthroplasty. Femoral stem appears well seated. Surgical drain is seen laterally. Previous left hip arthroplasty is noted. IMPRESSION: Right hip arthroplasty without complicating feature. Electronically Signed   By: Leanna Battles M.D.   On: 12/29/2018 11:18   Dg Hip Operative Unilat W Or W/o Pelvis Right  Result Date: 12/29/2018 CLINICAL DATA:  Revision hip replacement EXAM: OPERATIVE right HIP (WITH PELVIS IF PERFORMED) 1 VIEW TECHNIQUE: Fluoroscopic spot image(s) were submitted for interpretation post-operatively. COMPARISON:  None. FINDINGS: Post revision right total hip arthroplasty. A single acetabular screw is present. Clamp overlies the medial thigh. IMPRESSION: Post revision right total hip arthroplasty. Electronically Signed   By: Guadlupe Spanish M.D.   On: 12/29/2018 09:54    EKG: Orders placed or performed during the hospital encounter of 12/27/18  . EKG 12 lead  . EKG 12 lead     Hospital Course: Makayla Schultz is a 85 y.o. who was admitted to [facility]. They were brought to the operating room on 12/29/2018 and underwent Procedure(s): TOTAL HIP REVISION.  Patient tolerated the procedure well and was later transferred to the recovery room and then to the orthopaedic floor for postoperative care. They were given PO and IV analgesics for pain control following their surgery. They were given 24 hours of postoperative antibiotics of  Anti-infectives (From admission, onward)   Start     Dose/Rate Route Frequency Ordered Stop   12/29/18 1330  ceFAZolin (ANCEF) IVPB 2g/100 mL premix     2 g 200 mL/hr over 30 Minutes Intravenous Every 6 hours 12/29/18 1158 12/29/18 2017   12/29/18 0600  ceFAZolin (ANCEF) IVPB 2g/100 mL premix     2 g 200 mL/hr over 30 Minutes Intravenous On call to O.R. 12/29/18 6962  12/29/18 0713     and started on DVT prophylaxis in the form of Lovenox and Coumadin.   PT and OT were ordered for total joint protocol. Discharge planning consulted to help with postop disposition and  equipment needs. Patient had a good night on the evening of surgery. They started to get up OOB with therapy on POD #0. Hemovac drain was pulled without difficulty on day one. Continued to work with therapy into POD #2. Pt was seen during rounds on day two and was ready to go home pending progress with therapy. Hemoglobin was noted to be 7.5. Dressing was changed and the incision was clean, dry, and intact with no drainage. Plan was for patient to d/c home on day two, however her son was COVID exposed and receiving testing. Patient had been around son after initial hospital COVID test prior to surgery, so test was repeated prior to discharge. This came back negative and patient was meeting goals with physical therapy on POD #3. Son's COVID test also came back negative. Hemoglobin was holding stable at 7.5. Sent home with ferrous sulfate 325 mg BID x 3 weeks. She was ready to go home and was discharged later that day in stable condition.  Diet: Regular diet Activity: WBAT Follow-up: in 2 weeks Disposition: Home with HHPT Discharged Condition: stable   Discharge Instructions    Call MD / Call 911   Complete by: As directed    If you experience chest pain or shortness of breath, CALL 911 and be transported to the hospital emergency room.  If you develope a fever above 101 F, pus (white drainage) or increased drainage or redness at the wound, or calf pain, call your surgeon's office.   Change dressing   Complete by: As directed    You may change your dressing on Friday, then change the dressing daily with sterile 4 x 4 inch gauze dressing and paper tape.   Constipation Prevention   Complete by: As directed    Drink plenty of fluids.  Prune juice may be helpful.  You may use a stool softener, such as  Colace (over the counter) 100 mg twice a day.  Use MiraLax (over the counter) for constipation as needed.   Diet - low sodium heart healthy   Complete by: As directed    Follow the hip precautions as taught in Physical Therapy   Complete by: As directed    Weight bearing as tolerated   Complete by: As directed      Allergies as of 01/01/2019      Reactions   Lasix [furosemide] Other (See Comments)   Blood pressure dropped    Statins Other (See Comments)   Cramps in legs and arms   Tape Other (See Comments)   Cannot use paper tape clear tape takes the skin off   Penicillins Rash   Did it involve swelling of the face/tongue/throat, SOB, or low BP? No Did it involve sudden or severe rash/hives, skin peeling, or any reaction on the inside of your mouth or nose? No Did you need to seek medical attention at a hospital or doctor's office? No When did it last happen?30 years ago If all above answers are "NO", may proceed with cephalosporin use.      Medication List    TAKE these medications   acetaminophen 650 MG CR tablet Commonly known as: TYLENOL Take 1,950 mg by mouth 3 (three) times daily.   AIRBORNE GUMMIES PO Take 2 tablets by mouth at bedtime.   albuterol 108 (90 Base) MCG/ACT inhaler Commonly known as: VENTOLIN HFA Inhale 2 puffs into the lungs every 6 (six) hours as needed for wheezing.   albuterol 1.25 MG/3ML nebulizer solution Commonly known as:  ACCUNEB Take 1 ampule by nebulization every 6 (six) hours as needed for wheezing.   BENEFIBER DRINK MIX PO Take 10 mLs by mouth 2 (two) times daily.   budesonide-formoterol 160-4.5 MCG/ACT inhaler Commonly known as: SYMBICORT Inhale 2 puffs into the lungs every 6 (six) hours as needed (Wheezing/ Shortness of breath).   clindamycin 150 MG capsule Commonly known as: CLEOCIN Take 400 mg by mouth once. "for dental work"   clotrimazole-betamethasone cream Commonly known as: LOTRISONE Apply 1 application topically 2  (two) times daily. Itching   ferrous sulfate 325 (65 FE) MG tablet Take 1 tablet (325 mg total) by mouth 2 (two) times daily with a meal for 21 days.   HYDROcodone-acetaminophen 5-325 MG tablet Commonly known as: NORCO/VICODIN Take 1-2 tablets by mouth every 6 (six) hours as needed for severe pain.   hydrocortisone 25 MG suppository Commonly known as: ANUSOL-HC Place 25 mg rectally 2 (two) times daily as needed for hemorrhoids or anal itching.   hydrocortisone cream 1 % Apply 1 application topically 2 (two) times daily as needed for itching.   letrozole 2.5 MG tablet Commonly known as: FEMARA Take 2.5 mg by mouth at bedtime.   levothyroxine 88 MCG tablet Commonly known as: SYNTHROID Take 88 mcg by mouth daily before breakfast.   lidocaine 2 % jelly Commonly known as: XYLOCAINE Apply 1 application topically 3 (three) times daily as needed for pain.   loperamide 2 MG capsule Commonly known as: IMODIUM Take 2 mg by mouth at bedtime as needed for diarrhea or loose stools.   methocarbamol 500 MG tablet Commonly known as: ROBAXIN Take 1 tablet (500 mg total) by mouth every 6 (six) hours as needed for muscle spasms.   Myrbetriq 50 MG Tb24 tablet Generic drug: mirabegron ER Take 50 mg by mouth daily as needed (take for a period of time then come off).   neomycin-bacitracin-polymyxin ointment Commonly known as: NEOSPORIN Apply 1 application topically 2 (two) times daily as needed for wound care.   pantoprazole 40 MG tablet Commonly known as: PROTONIX Take 40 mg by mouth daily.   traMADol 50 MG tablet Commonly known as: ULTRAM Take 50-100 mg by mouth every 6 (six) hours.   Vitamin D3 50 MCG (2000 UT) Tabs Take 2,000 Units by mouth daily.   warfarin 5 MG tablet Commonly known as: COUMADIN Take 5 mg by mouth daily.            Discharge Care Instructions  (From admission, onward)         Start     Ordered   12/29/18 0000  Weight bearing as tolerated      12/29/18 1414   12/29/18 0000  Change dressing    Comments: You may change your dressing on Friday, then change the dressing daily with sterile 4 x 4 inch gauze dressing and paper tape.   12/29/18 1414         Follow-up Information    Ollen GrossAluisio, Frank, MD. Schedule an appointment as soon as possible for a visit on 01/13/2019.   Specialty: Orthopedic Surgery Contact information: [address] [place] [address] [phone]        [facility] Follow up.   Specialty: Home Health Services Why: agency will provide home health physical therapy, agency will call you to schedule first visit. Contact information: [address] [place] [address] [phone]           Signed: Arther AbbottKristie Aarit Kashuba, PA-C Orthopedic Surgery 01/03/2019,  9:03 AM

## 2019-02-08 DIAGNOSIS — Z96641 Presence of right artificial hip joint: Secondary | ICD-10-CM | POA: Diagnosis not present

## 2019-03-01 DIAGNOSIS — H9202 Otalgia, left ear: Secondary | ICD-10-CM | POA: Diagnosis not present

## 2019-03-01 DIAGNOSIS — L292 Pruritus vulvae: Secondary | ICD-10-CM | POA: Diagnosis not present

## 2019-03-01 DIAGNOSIS — R238 Other skin changes: Secondary | ICD-10-CM | POA: Diagnosis not present

## 2019-03-01 DIAGNOSIS — T148XXA Other injury of unspecified body region, initial encounter: Secondary | ICD-10-CM | POA: Diagnosis not present

## 2019-03-09 DIAGNOSIS — I872 Venous insufficiency (chronic) (peripheral): Secondary | ICD-10-CM | POA: Diagnosis not present

## 2019-03-09 DIAGNOSIS — L97909 Non-pressure chronic ulcer of unspecified part of unspecified lower leg with unspecified severity: Secondary | ICD-10-CM | POA: Diagnosis not present

## 2019-03-09 DIAGNOSIS — Z170 Estrogen receptor positive status [ER+]: Secondary | ICD-10-CM | POA: Diagnosis not present

## 2019-03-09 DIAGNOSIS — I83009 Varicose veins of unspecified lower extremity with ulcer of unspecified site: Secondary | ICD-10-CM | POA: Diagnosis not present

## 2019-03-09 DIAGNOSIS — L03119 Cellulitis of unspecified part of limb: Secondary | ICD-10-CM | POA: Diagnosis not present

## 2019-03-09 DIAGNOSIS — C50412 Malignant neoplasm of upper-outer quadrant of left female breast: Secondary | ICD-10-CM | POA: Diagnosis not present

## 2019-03-09 DIAGNOSIS — I739 Peripheral vascular disease, unspecified: Secondary | ICD-10-CM | POA: Diagnosis not present

## 2019-03-16 DIAGNOSIS — I83029 Varicose veins of left lower extremity with ulcer of unspecified site: Secondary | ICD-10-CM | POA: Diagnosis not present

## 2019-03-16 DIAGNOSIS — Z7901 Long term (current) use of anticoagulants: Secondary | ICD-10-CM | POA: Diagnosis not present

## 2019-03-16 DIAGNOSIS — I872 Venous insufficiency (chronic) (peripheral): Secondary | ICD-10-CM | POA: Diagnosis not present

## 2019-03-16 DIAGNOSIS — L97929 Non-pressure chronic ulcer of unspecified part of left lower leg with unspecified severity: Secondary | ICD-10-CM | POA: Diagnosis not present

## 2019-03-16 DIAGNOSIS — I739 Peripheral vascular disease, unspecified: Secondary | ICD-10-CM | POA: Diagnosis not present

## 2019-03-23 DIAGNOSIS — I83029 Varicose veins of left lower extremity with ulcer of unspecified site: Secondary | ICD-10-CM | POA: Diagnosis not present

## 2019-03-23 DIAGNOSIS — C50912 Malignant neoplasm of unspecified site of left female breast: Secondary | ICD-10-CM | POA: Diagnosis not present

## 2019-03-23 DIAGNOSIS — I872 Venous insufficiency (chronic) (peripheral): Secondary | ICD-10-CM | POA: Diagnosis not present

## 2019-03-23 DIAGNOSIS — L97929 Non-pressure chronic ulcer of unspecified part of left lower leg with unspecified severity: Secondary | ICD-10-CM | POA: Diagnosis not present

## 2019-03-30 DIAGNOSIS — I872 Venous insufficiency (chronic) (peripheral): Secondary | ICD-10-CM | POA: Diagnosis not present

## 2019-03-30 DIAGNOSIS — R5382 Chronic fatigue, unspecified: Secondary | ICD-10-CM | POA: Diagnosis not present

## 2019-03-30 DIAGNOSIS — Z7901 Long term (current) use of anticoagulants: Secondary | ICD-10-CM | POA: Diagnosis not present

## 2019-03-30 DIAGNOSIS — I890 Lymphedema, not elsewhere classified: Secondary | ICD-10-CM | POA: Diagnosis not present

## 2019-03-30 DIAGNOSIS — H25812 Combined forms of age-related cataract, left eye: Secondary | ICD-10-CM | POA: Diagnosis not present

## 2019-03-30 DIAGNOSIS — Z5181 Encounter for therapeutic drug level monitoring: Secondary | ICD-10-CM | POA: Diagnosis not present

## 2019-03-30 DIAGNOSIS — I83029 Varicose veins of left lower extremity with ulcer of unspecified site: Secondary | ICD-10-CM | POA: Diagnosis not present

## 2019-03-30 DIAGNOSIS — D6861 Antiphospholipid syndrome: Secondary | ICD-10-CM | POA: Diagnosis not present

## 2019-03-30 DIAGNOSIS — L97929 Non-pressure chronic ulcer of unspecified part of left lower leg with unspecified severity: Secondary | ICD-10-CM | POA: Diagnosis not present

## 2019-04-03 ENCOUNTER — Ambulatory Visit: Payer: Medicare PPO | Attending: Internal Medicine

## 2019-04-03 DIAGNOSIS — Z23 Encounter for immunization: Secondary | ICD-10-CM | POA: Insufficient documentation

## 2019-04-03 NOTE — Progress Notes (Signed)
   Covid-19 Vaccination Clinic  Name:  Magali Bray    MRN: [id number] DOB: 1930/03/07  04/03/2019  Ms. Ricard was observed post Covid-19 immunization for 15 minutes without incidence. She was provided with Vaccine Information Sheet and instruction to access the V-Safe system.   Ms. Brundage was instructed to call 911 with any severe reactions post vaccine: [place] Kitchen Difficulty breathing  . Swelling of your face and throat  . A fast heartbeat  . A bad rash all over your body  . Dizziness and weakness    Immunizations Administered    Name Date Dose VIS Date Route   Pfizer COVID-19 Vaccine 04/03/2019 10:11 AM 0.3 mL 01/14/2019 Intramuscular   Manufacturer: [employer], [employer]   Lot: [id number]   NDC: [address]-4

## 2019-04-06 DIAGNOSIS — I872 Venous insufficiency (chronic) (peripheral): Secondary | ICD-10-CM | POA: Diagnosis not present

## 2019-04-06 DIAGNOSIS — I739 Peripheral vascular disease, unspecified: Secondary | ICD-10-CM | POA: Diagnosis not present

## 2019-04-06 DIAGNOSIS — L97929 Non-pressure chronic ulcer of unspecified part of left lower leg with unspecified severity: Secondary | ICD-10-CM | POA: Diagnosis not present

## 2019-04-06 DIAGNOSIS — I83029 Varicose veins of left lower extremity with ulcer of unspecified site: Secondary | ICD-10-CM | POA: Diagnosis not present

## 2019-04-12 DIAGNOSIS — L97322 Non-pressure chronic ulcer of left ankle with fat layer exposed: Secondary | ICD-10-CM | POA: Diagnosis not present

## 2019-04-13 DIAGNOSIS — H18453 Nodular corneal degeneration, bilateral: Secondary | ICD-10-CM | POA: Diagnosis not present

## 2019-04-13 DIAGNOSIS — H26493 Other secondary cataract, bilateral: Secondary | ICD-10-CM | POA: Diagnosis not present

## 2019-04-13 DIAGNOSIS — Z961 Presence of intraocular lens: Secondary | ICD-10-CM | POA: Diagnosis not present

## 2019-04-13 DIAGNOSIS — H353131 Nonexudative age-related macular degeneration, bilateral, early dry stage: Secondary | ICD-10-CM | POA: Diagnosis not present

## 2019-04-13 DIAGNOSIS — H527 Unspecified disorder of refraction: Secondary | ICD-10-CM | POA: Diagnosis not present

## 2019-04-13 DIAGNOSIS — H02834 Dermatochalasis of left upper eyelid: Secondary | ICD-10-CM | POA: Diagnosis not present

## 2019-04-19 DIAGNOSIS — K5900 Constipation, unspecified: Secondary | ICD-10-CM | POA: Diagnosis not present

## 2019-04-19 DIAGNOSIS — R110 Nausea: Secondary | ICD-10-CM | POA: Diagnosis not present

## 2019-04-19 DIAGNOSIS — K219 Gastro-esophageal reflux disease without esophagitis: Secondary | ICD-10-CM | POA: Diagnosis not present

## 2019-04-20 DIAGNOSIS — I739 Peripheral vascular disease, unspecified: Secondary | ICD-10-CM | POA: Diagnosis not present

## 2019-04-20 DIAGNOSIS — I872 Venous insufficiency (chronic) (peripheral): Secondary | ICD-10-CM | POA: Diagnosis not present

## 2019-04-20 DIAGNOSIS — Z7901 Long term (current) use of anticoagulants: Secondary | ICD-10-CM | POA: Diagnosis not present

## 2019-04-20 DIAGNOSIS — L97322 Non-pressure chronic ulcer of left ankle with fat layer exposed: Secondary | ICD-10-CM | POA: Diagnosis not present

## 2019-04-21 DIAGNOSIS — L940 Localized scleroderma [morphea]: Secondary | ICD-10-CM | POA: Diagnosis not present

## 2019-04-21 DIAGNOSIS — N7689 Other specified inflammation of vagina and vulva: Secondary | ICD-10-CM | POA: Diagnosis not present

## 2019-04-27 DIAGNOSIS — L97929 Non-pressure chronic ulcer of unspecified part of left lower leg with unspecified severity: Secondary | ICD-10-CM | POA: Diagnosis not present

## 2019-04-27 DIAGNOSIS — I83029 Varicose veins of left lower extremity with ulcer of unspecified site: Secondary | ICD-10-CM | POA: Diagnosis not present

## 2019-04-27 DIAGNOSIS — I872 Venous insufficiency (chronic) (peripheral): Secondary | ICD-10-CM | POA: Diagnosis not present

## 2019-04-27 DIAGNOSIS — I890 Lymphedema, not elsewhere classified: Secondary | ICD-10-CM | POA: Diagnosis not present

## 2019-05-03 ENCOUNTER — Ambulatory Visit: Payer: Medicare PPO | Attending: Internal Medicine

## 2019-05-03 DIAGNOSIS — Z23 Encounter for immunization: Secondary | ICD-10-CM

## 2019-05-03 NOTE — Progress Notes (Signed)
   Covid-19 Vaccination Clinic  Name:  Makayla Schultz    MRN: [id number] DOB: [birth year]  05/03/2019  [name] was observed post Covid-19 immunization for 15 minutes without incident. She was provided with Vaccine Information Sheet and instruction to access the V-Safe system.   [name] was instructed to call 911 with any severe reactions post vaccine: [place] Kitchen Difficulty breathing  . Swelling of face and throat  . A fast heartbeat  . A bad rash all over body  . Dizziness and weakness   Immunizations Administered    Name Date Dose VIS Date Route   Pfizer COVID-19 Vaccine 05/03/2019 10:39 AM 0.3 mL 01/14/2019 Intramuscular   Manufacturer: [employer], [employer]   Lot: [id number]   NDC: [address]-4

## 2019-05-04 DIAGNOSIS — R609 Edema, unspecified: Secondary | ICD-10-CM | POA: Diagnosis not present

## 2019-05-04 DIAGNOSIS — I739 Peripheral vascular disease, unspecified: Secondary | ICD-10-CM | POA: Diagnosis not present

## 2019-05-04 DIAGNOSIS — L97929 Non-pressure chronic ulcer of unspecified part of left lower leg with unspecified severity: Secondary | ICD-10-CM | POA: Diagnosis not present

## 2019-05-04 DIAGNOSIS — I83029 Varicose veins of left lower extremity with ulcer of unspecified site: Secondary | ICD-10-CM | POA: Diagnosis not present

## 2019-05-04 DIAGNOSIS — I872 Venous insufficiency (chronic) (peripheral): Secondary | ICD-10-CM | POA: Diagnosis not present

## 2019-05-04 DIAGNOSIS — Z7901 Long term (current) use of anticoagulants: Secondary | ICD-10-CM | POA: Diagnosis not present

## 2019-05-13 DIAGNOSIS — H532 Diplopia: Secondary | ICD-10-CM | POA: Diagnosis not present

## 2019-05-13 DIAGNOSIS — H353132 Nonexudative age-related macular degeneration, bilateral, intermediate dry stage: Secondary | ICD-10-CM | POA: Diagnosis not present

## 2019-05-13 DIAGNOSIS — H18503 Unspecified hereditary corneal dystrophies, bilateral: Secondary | ICD-10-CM | POA: Diagnosis not present

## 2019-05-13 DIAGNOSIS — H26492 Other secondary cataract, left eye: Secondary | ICD-10-CM | POA: Diagnosis not present

## 2019-05-25 DIAGNOSIS — Z7901 Long term (current) use of anticoagulants: Secondary | ICD-10-CM | POA: Diagnosis not present

## 2019-06-14 DIAGNOSIS — Z7901 Long term (current) use of anticoagulants: Secondary | ICD-10-CM | POA: Diagnosis not present

## 2019-06-14 DIAGNOSIS — K219 Gastro-esophageal reflux disease without esophagitis: Secondary | ICD-10-CM | POA: Diagnosis not present

## 2019-06-14 DIAGNOSIS — E7800 Pure hypercholesterolemia, unspecified: Secondary | ICD-10-CM | POA: Diagnosis not present

## 2019-06-14 DIAGNOSIS — E039 Hypothyroidism, unspecified: Secondary | ICD-10-CM | POA: Diagnosis not present

## 2019-06-14 DIAGNOSIS — D6861 Antiphospholipid syndrome: Secondary | ICD-10-CM | POA: Diagnosis not present

## 2019-06-17 DIAGNOSIS — C50912 Malignant neoplasm of unspecified site of left female breast: Secondary | ICD-10-CM | POA: Diagnosis not present

## 2019-06-17 DIAGNOSIS — Z170 Estrogen receptor positive status [ER+]: Secondary | ICD-10-CM | POA: Diagnosis not present

## 2019-06-17 DIAGNOSIS — Z79899 Other long term (current) drug therapy: Secondary | ICD-10-CM | POA: Diagnosis not present

## 2019-06-30 DIAGNOSIS — L940 Localized scleroderma [morphea]: Secondary | ICD-10-CM | POA: Diagnosis not present

## 2019-07-13 DIAGNOSIS — L821 Other seborrheic keratosis: Secondary | ICD-10-CM | POA: Diagnosis not present

## 2019-07-13 DIAGNOSIS — L570 Actinic keratosis: Secondary | ICD-10-CM | POA: Diagnosis not present

## 2019-07-13 DIAGNOSIS — L814 Other melanin hyperpigmentation: Secondary | ICD-10-CM | POA: Diagnosis not present

## 2019-07-13 DIAGNOSIS — X32XXXS Exposure to sunlight, sequela: Secondary | ICD-10-CM | POA: Diagnosis not present

## 2019-07-13 DIAGNOSIS — L608 Other nail disorders: Secondary | ICD-10-CM | POA: Diagnosis not present

## 2019-07-14 DIAGNOSIS — H532 Diplopia: Secondary | ICD-10-CM | POA: Diagnosis not present

## 2019-07-14 DIAGNOSIS — H18503 Unspecified hereditary corneal dystrophies, bilateral: Secondary | ICD-10-CM | POA: Diagnosis not present

## 2019-07-14 DIAGNOSIS — H353132 Nonexudative age-related macular degeneration, bilateral, intermediate dry stage: Secondary | ICD-10-CM | POA: Diagnosis not present

## 2019-07-14 DIAGNOSIS — H18453 Nodular corneal degeneration, bilateral: Secondary | ICD-10-CM | POA: Diagnosis not present

## 2019-07-15 DIAGNOSIS — R110 Nausea: Secondary | ICD-10-CM | POA: Diagnosis not present

## 2019-07-15 DIAGNOSIS — K5901 Slow transit constipation: Secondary | ICD-10-CM | POA: Diagnosis not present

## 2019-07-15 DIAGNOSIS — K219 Gastro-esophageal reflux disease without esophagitis: Secondary | ICD-10-CM | POA: Diagnosis not present

## 2019-07-21 DIAGNOSIS — Z7901 Long term (current) use of anticoagulants: Secondary | ICD-10-CM | POA: Diagnosis not present

## 2019-08-12 DIAGNOSIS — Z7901 Long term (current) use of anticoagulants: Secondary | ICD-10-CM | POA: Diagnosis not present

## 2019-08-25 DIAGNOSIS — Z961 Presence of intraocular lens: Secondary | ICD-10-CM | POA: Diagnosis not present

## 2019-08-25 DIAGNOSIS — H353131 Nonexudative age-related macular degeneration, bilateral, early dry stage: Secondary | ICD-10-CM | POA: Diagnosis not present

## 2019-08-25 DIAGNOSIS — H26492 Other secondary cataract, left eye: Secondary | ICD-10-CM | POA: Diagnosis not present

## 2019-08-25 DIAGNOSIS — H18593 Other hereditary corneal dystrophies, bilateral: Secondary | ICD-10-CM | POA: Diagnosis not present

## 2019-08-26 DIAGNOSIS — R8290 Unspecified abnormal findings in urine: Secondary | ICD-10-CM | POA: Diagnosis not present

## 2019-08-26 DIAGNOSIS — Z7901 Long term (current) use of anticoagulants: Secondary | ICD-10-CM | POA: Diagnosis not present

## 2019-09-06 DIAGNOSIS — R609 Edema, unspecified: Secondary | ICD-10-CM | POA: Diagnosis not present

## 2019-09-06 DIAGNOSIS — L97929 Non-pressure chronic ulcer of unspecified part of left lower leg with unspecified severity: Secondary | ICD-10-CM | POA: Diagnosis not present

## 2019-09-06 DIAGNOSIS — I872 Venous insufficiency (chronic) (peripheral): Secondary | ICD-10-CM | POA: Diagnosis not present

## 2019-09-06 DIAGNOSIS — L03116 Cellulitis of left lower limb: Secondary | ICD-10-CM | POA: Diagnosis not present

## 2019-09-06 DIAGNOSIS — I83029 Varicose veins of left lower extremity with ulcer of unspecified site: Secondary | ICD-10-CM | POA: Diagnosis not present

## 2019-09-07 DIAGNOSIS — N6489 Other specified disorders of breast: Secondary | ICD-10-CM | POA: Diagnosis not present

## 2019-09-07 DIAGNOSIS — R928 Other abnormal and inconclusive findings on diagnostic imaging of breast: Secondary | ICD-10-CM | POA: Diagnosis not present

## 2019-09-07 DIAGNOSIS — Z9889 Other specified postprocedural states: Secondary | ICD-10-CM | POA: Diagnosis not present

## 2019-09-07 DIAGNOSIS — C50012 Malignant neoplasm of nipple and areola, left female breast: Secondary | ICD-10-CM | POA: Diagnosis not present

## 2019-09-12 DIAGNOSIS — H353132 Nonexudative age-related macular degeneration, bilateral, intermediate dry stage: Secondary | ICD-10-CM | POA: Diagnosis not present

## 2019-09-12 DIAGNOSIS — H527 Unspecified disorder of refraction: Secondary | ICD-10-CM | POA: Diagnosis not present

## 2019-09-12 DIAGNOSIS — H18453 Nodular corneal degeneration, bilateral: Secondary | ICD-10-CM | POA: Diagnosis not present

## 2019-09-12 DIAGNOSIS — H26492 Other secondary cataract, left eye: Secondary | ICD-10-CM | POA: Diagnosis not present

## 2019-09-12 DIAGNOSIS — H532 Diplopia: Secondary | ICD-10-CM | POA: Diagnosis not present

## 2019-09-12 DIAGNOSIS — H18503 Unspecified hereditary corneal dystrophies, bilateral: Secondary | ICD-10-CM | POA: Diagnosis not present

## 2019-09-12 DIAGNOSIS — H02834 Dermatochalasis of left upper eyelid: Secondary | ICD-10-CM | POA: Diagnosis not present

## 2019-09-12 DIAGNOSIS — Z961 Presence of intraocular lens: Secondary | ICD-10-CM | POA: Diagnosis not present

## 2019-09-14 DIAGNOSIS — L97929 Non-pressure chronic ulcer of unspecified part of left lower leg with unspecified severity: Secondary | ICD-10-CM | POA: Diagnosis not present

## 2019-09-14 DIAGNOSIS — I890 Lymphedema, not elsewhere classified: Secondary | ICD-10-CM | POA: Diagnosis not present

## 2019-09-14 DIAGNOSIS — R609 Edema, unspecified: Secondary | ICD-10-CM | POA: Diagnosis not present

## 2019-09-14 DIAGNOSIS — I83029 Varicose veins of left lower extremity with ulcer of unspecified site: Secondary | ICD-10-CM | POA: Diagnosis not present

## 2019-09-14 DIAGNOSIS — I872 Venous insufficiency (chronic) (peripheral): Secondary | ICD-10-CM | POA: Diagnosis not present

## 2019-09-19 DIAGNOSIS — I83029 Varicose veins of left lower extremity with ulcer of unspecified site: Secondary | ICD-10-CM | POA: Diagnosis not present

## 2019-09-19 DIAGNOSIS — L97929 Non-pressure chronic ulcer of unspecified part of left lower leg with unspecified severity: Secondary | ICD-10-CM | POA: Diagnosis not present

## 2019-09-19 DIAGNOSIS — D6861 Antiphospholipid syndrome: Secondary | ICD-10-CM | POA: Diagnosis not present

## 2019-09-19 DIAGNOSIS — I739 Peripheral vascular disease, unspecified: Secondary | ICD-10-CM | POA: Diagnosis not present

## 2019-09-19 DIAGNOSIS — I872 Venous insufficiency (chronic) (peripheral): Secondary | ICD-10-CM | POA: Diagnosis not present

## 2019-09-19 DIAGNOSIS — L97322 Non-pressure chronic ulcer of left ankle with fat layer exposed: Secondary | ICD-10-CM | POA: Diagnosis not present

## 2019-09-23 DIAGNOSIS — R5383 Other fatigue: Secondary | ICD-10-CM | POA: Diagnosis not present

## 2019-09-23 DIAGNOSIS — Z1379 Encounter for other screening for genetic and chromosomal anomalies: Secondary | ICD-10-CM | POA: Diagnosis not present

## 2019-09-23 DIAGNOSIS — J9859 Other diseases of mediastinum, not elsewhere classified: Secondary | ICD-10-CM | POA: Diagnosis not present

## 2019-09-23 DIAGNOSIS — L97322 Non-pressure chronic ulcer of left ankle with fat layer exposed: Secondary | ICD-10-CM | POA: Diagnosis not present

## 2019-09-23 DIAGNOSIS — C50912 Malignant neoplasm of unspecified site of left female breast: Secondary | ICD-10-CM | POA: Diagnosis not present

## 2019-09-23 DIAGNOSIS — K5901 Slow transit constipation: Secondary | ICD-10-CM | POA: Diagnosis not present

## 2019-09-23 DIAGNOSIS — E039 Hypothyroidism, unspecified: Secondary | ICD-10-CM | POA: Diagnosis not present

## 2019-09-23 DIAGNOSIS — Z170 Estrogen receptor positive status [ER+]: Secondary | ICD-10-CM | POA: Diagnosis not present

## 2019-09-23 DIAGNOSIS — Z7901 Long term (current) use of anticoagulants: Secondary | ICD-10-CM | POA: Diagnosis not present

## 2019-09-23 DIAGNOSIS — M85831 Other specified disorders of bone density and structure, right forearm: Secondary | ICD-10-CM | POA: Diagnosis not present

## 2019-09-23 DIAGNOSIS — R011 Cardiac murmur, unspecified: Secondary | ICD-10-CM | POA: Diagnosis not present

## 2019-09-23 DIAGNOSIS — I272 Pulmonary hypertension, unspecified: Secondary | ICD-10-CM | POA: Diagnosis not present

## 2019-09-23 DIAGNOSIS — C50412 Malignant neoplasm of upper-outer quadrant of left female breast: Secondary | ICD-10-CM | POA: Diagnosis not present

## 2019-09-27 DIAGNOSIS — I739 Peripheral vascular disease, unspecified: Secondary | ICD-10-CM | POA: Diagnosis not present

## 2019-09-27 DIAGNOSIS — R609 Edema, unspecified: Secondary | ICD-10-CM | POA: Diagnosis not present

## 2019-09-27 DIAGNOSIS — L97929 Non-pressure chronic ulcer of unspecified part of left lower leg with unspecified severity: Secondary | ICD-10-CM | POA: Diagnosis not present

## 2019-09-27 DIAGNOSIS — I83029 Varicose veins of left lower extremity with ulcer of unspecified site: Secondary | ICD-10-CM | POA: Diagnosis not present

## 2019-09-27 DIAGNOSIS — R0602 Shortness of breath: Secondary | ICD-10-CM | POA: Diagnosis not present

## 2019-09-27 DIAGNOSIS — R002 Palpitations: Secondary | ICD-10-CM | POA: Diagnosis not present

## 2019-09-27 DIAGNOSIS — I890 Lymphedema, not elsewhere classified: Secondary | ICD-10-CM | POA: Diagnosis not present

## 2019-09-27 DIAGNOSIS — I358 Other nonrheumatic aortic valve disorders: Secondary | ICD-10-CM | POA: Diagnosis not present

## 2019-09-27 DIAGNOSIS — I493 Ventricular premature depolarization: Secondary | ICD-10-CM | POA: Diagnosis not present

## 2019-10-05 DIAGNOSIS — Z20828 Contact with and (suspected) exposure to other viral communicable diseases: Secondary | ICD-10-CM | POA: Diagnosis not present

## 2019-10-11 DIAGNOSIS — L97321 Non-pressure chronic ulcer of left ankle limited to breakdown of skin: Secondary | ICD-10-CM | POA: Diagnosis not present

## 2019-10-11 DIAGNOSIS — I872 Venous insufficiency (chronic) (peripheral): Secondary | ICD-10-CM | POA: Diagnosis not present

## 2019-10-19 DIAGNOSIS — L97321 Non-pressure chronic ulcer of left ankle limited to breakdown of skin: Secondary | ICD-10-CM | POA: Diagnosis not present

## 2019-10-19 DIAGNOSIS — R002 Palpitations: Secondary | ICD-10-CM | POA: Diagnosis not present

## 2019-10-26 DIAGNOSIS — R0602 Shortness of breath: Secondary | ICD-10-CM | POA: Diagnosis not present

## 2019-10-27 DIAGNOSIS — L97321 Non-pressure chronic ulcer of left ankle limited to breakdown of skin: Secondary | ICD-10-CM | POA: Diagnosis not present

## 2019-10-27 DIAGNOSIS — I872 Venous insufficiency (chronic) (peripheral): Secondary | ICD-10-CM | POA: Diagnosis not present

## 2019-11-02 DIAGNOSIS — L97321 Non-pressure chronic ulcer of left ankle limited to breakdown of skin: Secondary | ICD-10-CM | POA: Diagnosis not present

## 2019-11-02 DIAGNOSIS — Z7901 Long term (current) use of anticoagulants: Secondary | ICD-10-CM | POA: Diagnosis not present

## 2019-11-02 DIAGNOSIS — I872 Venous insufficiency (chronic) (peripheral): Secondary | ICD-10-CM | POA: Diagnosis not present

## 2019-11-08 DIAGNOSIS — R0602 Shortness of breath: Secondary | ICD-10-CM | POA: Diagnosis not present

## 2019-11-08 DIAGNOSIS — I358 Other nonrheumatic aortic valve disorders: Secondary | ICD-10-CM | POA: Diagnosis not present

## 2019-11-08 DIAGNOSIS — M2042 Other hammer toe(s) (acquired), left foot: Secondary | ICD-10-CM | POA: Diagnosis not present

## 2019-11-08 DIAGNOSIS — I471 Supraventricular tachycardia: Secondary | ICD-10-CM | POA: Diagnosis not present

## 2019-11-08 DIAGNOSIS — B351 Tinea unguium: Secondary | ICD-10-CM | POA: Diagnosis not present

## 2019-11-08 DIAGNOSIS — M109 Gout, unspecified: Secondary | ICD-10-CM | POA: Diagnosis not present

## 2019-11-08 DIAGNOSIS — I493 Ventricular premature depolarization: Secondary | ICD-10-CM | POA: Diagnosis not present

## 2019-11-08 DIAGNOSIS — Z86718 Personal history of other venous thrombosis and embolism: Secondary | ICD-10-CM | POA: Diagnosis not present

## 2019-11-08 DIAGNOSIS — M2041 Other hammer toe(s) (acquired), right foot: Secondary | ICD-10-CM | POA: Diagnosis not present

## 2019-11-08 DIAGNOSIS — I272 Pulmonary hypertension, unspecified: Secondary | ICD-10-CM | POA: Diagnosis not present

## 2019-11-08 DIAGNOSIS — L602 Onychogryphosis: Secondary | ICD-10-CM | POA: Diagnosis not present

## 2019-11-16 DIAGNOSIS — I872 Venous insufficiency (chronic) (peripheral): Secondary | ICD-10-CM | POA: Diagnosis not present

## 2019-11-16 DIAGNOSIS — L97321 Non-pressure chronic ulcer of left ankle limited to breakdown of skin: Secondary | ICD-10-CM | POA: Diagnosis not present

## 2019-11-23 DIAGNOSIS — L97322 Non-pressure chronic ulcer of left ankle with fat layer exposed: Secondary | ICD-10-CM | POA: Diagnosis not present

## 2019-11-23 DIAGNOSIS — I872 Venous insufficiency (chronic) (peripheral): Secondary | ICD-10-CM | POA: Diagnosis not present

## 2019-11-23 DIAGNOSIS — L97321 Non-pressure chronic ulcer of left ankle limited to breakdown of skin: Secondary | ICD-10-CM | POA: Diagnosis not present

## 2019-11-23 DIAGNOSIS — Z7901 Long term (current) use of anticoagulants: Secondary | ICD-10-CM | POA: Diagnosis not present

## 2019-11-28 DIAGNOSIS — L97322 Non-pressure chronic ulcer of left ankle with fat layer exposed: Secondary | ICD-10-CM | POA: Diagnosis not present

## 2019-11-28 DIAGNOSIS — L97321 Non-pressure chronic ulcer of left ankle limited to breakdown of skin: Secondary | ICD-10-CM | POA: Diagnosis not present

## 2019-11-28 DIAGNOSIS — T8133XD Disruption of traumatic injury wound repair, subsequent encounter: Secondary | ICD-10-CM | POA: Diagnosis not present

## 2019-11-28 DIAGNOSIS — I739 Peripheral vascular disease, unspecified: Secondary | ICD-10-CM | POA: Diagnosis not present

## 2019-12-06 DIAGNOSIS — I872 Venous insufficiency (chronic) (peripheral): Secondary | ICD-10-CM | POA: Diagnosis not present

## 2019-12-06 DIAGNOSIS — I890 Lymphedema, not elsewhere classified: Secondary | ICD-10-CM | POA: Diagnosis not present

## 2019-12-06 DIAGNOSIS — R609 Edema, unspecified: Secondary | ICD-10-CM | POA: Diagnosis not present

## 2019-12-06 DIAGNOSIS — L97322 Non-pressure chronic ulcer of left ankle with fat layer exposed: Secondary | ICD-10-CM | POA: Diagnosis not present

## 2019-12-07 DIAGNOSIS — R110 Nausea: Secondary | ICD-10-CM | POA: Diagnosis not present

## 2019-12-07 DIAGNOSIS — Z86010 Personal history of colonic polyps: Secondary | ICD-10-CM | POA: Diagnosis not present

## 2019-12-07 DIAGNOSIS — R197 Diarrhea, unspecified: Secondary | ICD-10-CM | POA: Diagnosis not present

## 2019-12-07 DIAGNOSIS — K5901 Slow transit constipation: Secondary | ICD-10-CM | POA: Diagnosis not present

## 2019-12-07 DIAGNOSIS — K219 Gastro-esophageal reflux disease without esophagitis: Secondary | ICD-10-CM | POA: Diagnosis not present

## 2019-12-13 DIAGNOSIS — I872 Venous insufficiency (chronic) (peripheral): Secondary | ICD-10-CM | POA: Diagnosis not present

## 2019-12-13 DIAGNOSIS — L97321 Non-pressure chronic ulcer of left ankle limited to breakdown of skin: Secondary | ICD-10-CM | POA: Diagnosis not present

## 2019-12-20 DIAGNOSIS — I872 Venous insufficiency (chronic) (peripheral): Secondary | ICD-10-CM | POA: Diagnosis not present

## 2019-12-20 DIAGNOSIS — I890 Lymphedema, not elsewhere classified: Secondary | ICD-10-CM | POA: Diagnosis not present

## 2019-12-20 DIAGNOSIS — I739 Peripheral vascular disease, unspecified: Secondary | ICD-10-CM | POA: Diagnosis not present

## 2019-12-20 DIAGNOSIS — I83029 Varicose veins of left lower extremity with ulcer of unspecified site: Secondary | ICD-10-CM | POA: Diagnosis not present

## 2019-12-20 DIAGNOSIS — L97929 Non-pressure chronic ulcer of unspecified part of left lower leg with unspecified severity: Secondary | ICD-10-CM | POA: Diagnosis not present

## 2019-12-20 DIAGNOSIS — L97322 Non-pressure chronic ulcer of left ankle with fat layer exposed: Secondary | ICD-10-CM | POA: Diagnosis not present

## 2019-12-22 DIAGNOSIS — N903 Dysplasia of vulva, unspecified: Secondary | ICD-10-CM | POA: Diagnosis not present

## 2019-12-22 DIAGNOSIS — N7689 Other specified inflammation of vagina and vulva: Secondary | ICD-10-CM | POA: Diagnosis not present

## 2019-12-22 DIAGNOSIS — N762 Acute vulvitis: Secondary | ICD-10-CM | POA: Diagnosis not present

## 2019-12-22 DIAGNOSIS — L449 Papulosquamous disorder, unspecified: Secondary | ICD-10-CM | POA: Diagnosis not present

## 2020-01-03 DIAGNOSIS — E538 Deficiency of other specified B group vitamins: Secondary | ICD-10-CM | POA: Diagnosis not present

## 2020-01-03 DIAGNOSIS — M899 Disorder of bone, unspecified: Secondary | ICD-10-CM | POA: Diagnosis not present

## 2020-01-03 DIAGNOSIS — Z7901 Long term (current) use of anticoagulants: Secondary | ICD-10-CM | POA: Diagnosis not present

## 2020-01-03 DIAGNOSIS — E7800 Pure hypercholesterolemia, unspecified: Secondary | ICD-10-CM | POA: Diagnosis not present

## 2020-01-03 DIAGNOSIS — M8580 Other specified disorders of bone density and structure, unspecified site: Secondary | ICD-10-CM | POA: Diagnosis not present

## 2020-01-03 DIAGNOSIS — Z0000 Encounter for general adult medical examination without abnormal findings: Secondary | ICD-10-CM | POA: Diagnosis not present

## 2020-01-03 DIAGNOSIS — E039 Hypothyroidism, unspecified: Secondary | ICD-10-CM | POA: Diagnosis not present

## 2020-01-03 DIAGNOSIS — K219 Gastro-esophageal reflux disease without esophagitis: Secondary | ICD-10-CM | POA: Diagnosis not present

## 2020-01-03 DIAGNOSIS — R5383 Other fatigue: Secondary | ICD-10-CM | POA: Diagnosis not present

## 2020-01-03 DIAGNOSIS — J449 Chronic obstructive pulmonary disease, unspecified: Secondary | ICD-10-CM | POA: Diagnosis not present

## 2020-01-04 DIAGNOSIS — I872 Venous insufficiency (chronic) (peripheral): Secondary | ICD-10-CM | POA: Diagnosis not present

## 2020-01-04 DIAGNOSIS — L97322 Non-pressure chronic ulcer of left ankle with fat layer exposed: Secondary | ICD-10-CM | POA: Diagnosis not present

## 2020-01-06 DIAGNOSIS — C50912 Malignant neoplasm of unspecified site of left female breast: Secondary | ICD-10-CM | POA: Diagnosis not present

## 2020-01-06 DIAGNOSIS — M8580 Other specified disorders of bone density and structure, unspecified site: Secondary | ICD-10-CM | POA: Diagnosis not present

## 2020-01-11 DIAGNOSIS — I872 Venous insufficiency (chronic) (peripheral): Secondary | ICD-10-CM | POA: Diagnosis not present

## 2020-01-11 DIAGNOSIS — L97322 Non-pressure chronic ulcer of left ankle with fat layer exposed: Secondary | ICD-10-CM | POA: Diagnosis not present

## 2020-01-12 DIAGNOSIS — L439 Lichen planus, unspecified: Secondary | ICD-10-CM | POA: Diagnosis not present

## 2020-01-12 DIAGNOSIS — R300 Dysuria: Secondary | ICD-10-CM | POA: Diagnosis not present

## 2020-01-12 DIAGNOSIS — N904 Leukoplakia of vulva: Secondary | ICD-10-CM | POA: Diagnosis not present

## 2020-01-18 DIAGNOSIS — L97322 Non-pressure chronic ulcer of left ankle with fat layer exposed: Secondary | ICD-10-CM | POA: Diagnosis not present

## 2020-01-18 DIAGNOSIS — Z7901 Long term (current) use of anticoagulants: Secondary | ICD-10-CM | POA: Diagnosis not present

## 2020-01-18 DIAGNOSIS — I872 Venous insufficiency (chronic) (peripheral): Secondary | ICD-10-CM | POA: Diagnosis not present

## 2020-01-20 DIAGNOSIS — H353132 Nonexudative age-related macular degeneration, bilateral, intermediate dry stage: Secondary | ICD-10-CM | POA: Diagnosis not present

## 2020-01-20 DIAGNOSIS — H43811 Vitreous degeneration, right eye: Secondary | ICD-10-CM | POA: Diagnosis not present

## 2020-01-20 DIAGNOSIS — H539 Unspecified visual disturbance: Secondary | ICD-10-CM | POA: Diagnosis not present

## 2020-01-25 DIAGNOSIS — I872 Venous insufficiency (chronic) (peripheral): Secondary | ICD-10-CM | POA: Diagnosis not present

## 2020-01-25 DIAGNOSIS — T8133XD Disruption of traumatic injury wound repair, subsequent encounter: Secondary | ICD-10-CM | POA: Diagnosis not present

## 2020-01-25 DIAGNOSIS — I739 Peripheral vascular disease, unspecified: Secondary | ICD-10-CM | POA: Diagnosis not present

## 2020-01-25 DIAGNOSIS — L97321 Non-pressure chronic ulcer of left ankle limited to breakdown of skin: Secondary | ICD-10-CM | POA: Diagnosis not present

## 2020-01-30 DIAGNOSIS — I739 Peripheral vascular disease, unspecified: Secondary | ICD-10-CM | POA: Diagnosis not present

## 2020-01-30 DIAGNOSIS — T8133XD Disruption of traumatic injury wound repair, subsequent encounter: Secondary | ICD-10-CM | POA: Diagnosis not present

## 2020-01-30 DIAGNOSIS — L97321 Non-pressure chronic ulcer of left ankle limited to breakdown of skin: Secondary | ICD-10-CM | POA: Diagnosis not present

## 2020-01-30 DIAGNOSIS — I872 Venous insufficiency (chronic) (peripheral): Secondary | ICD-10-CM | POA: Diagnosis not present

## 2020-02-02 DIAGNOSIS — Z7901 Long term (current) use of anticoagulants: Secondary | ICD-10-CM | POA: Diagnosis not present

## 2020-02-02 DIAGNOSIS — L97322 Non-pressure chronic ulcer of left ankle with fat layer exposed: Secondary | ICD-10-CM | POA: Diagnosis not present

## 2020-02-02 DIAGNOSIS — I872 Venous insufficiency (chronic) (peripheral): Secondary | ICD-10-CM | POA: Diagnosis not present

## 2020-12-04 IMAGING — DX DG HIP (WITH PELVIS) OPERATIVE*R*
2 series · 2 of 2 positions shown · non-contrast
Comparison: None.

CLINICAL DATA: Revision hip replacement

EXAM:
OPERATIVE right HIP (WITH PELVIS IF PERFORMED) 1 VIEW
TECHNIQUE: Fluoroscopic spot image(s) were submitted for interpretation
post-operatively.

[hip ap (1 of 2)]
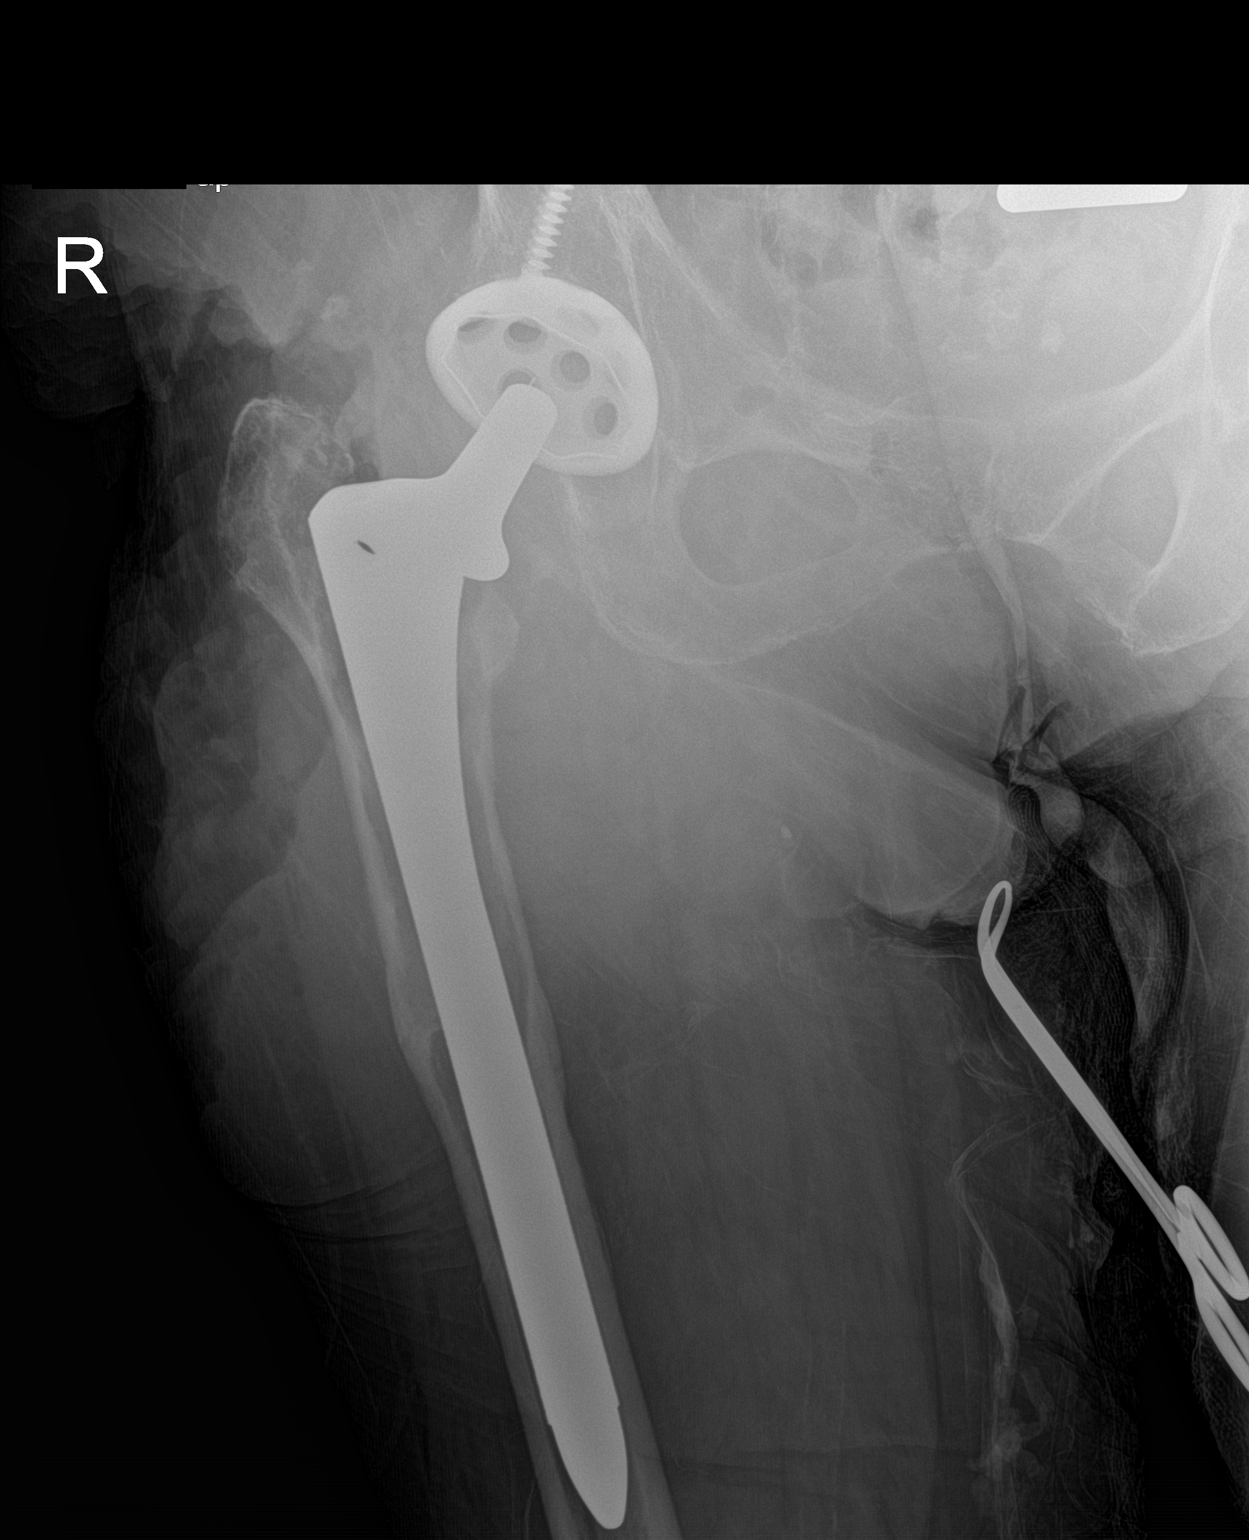

[hip ap (2 of 2)]
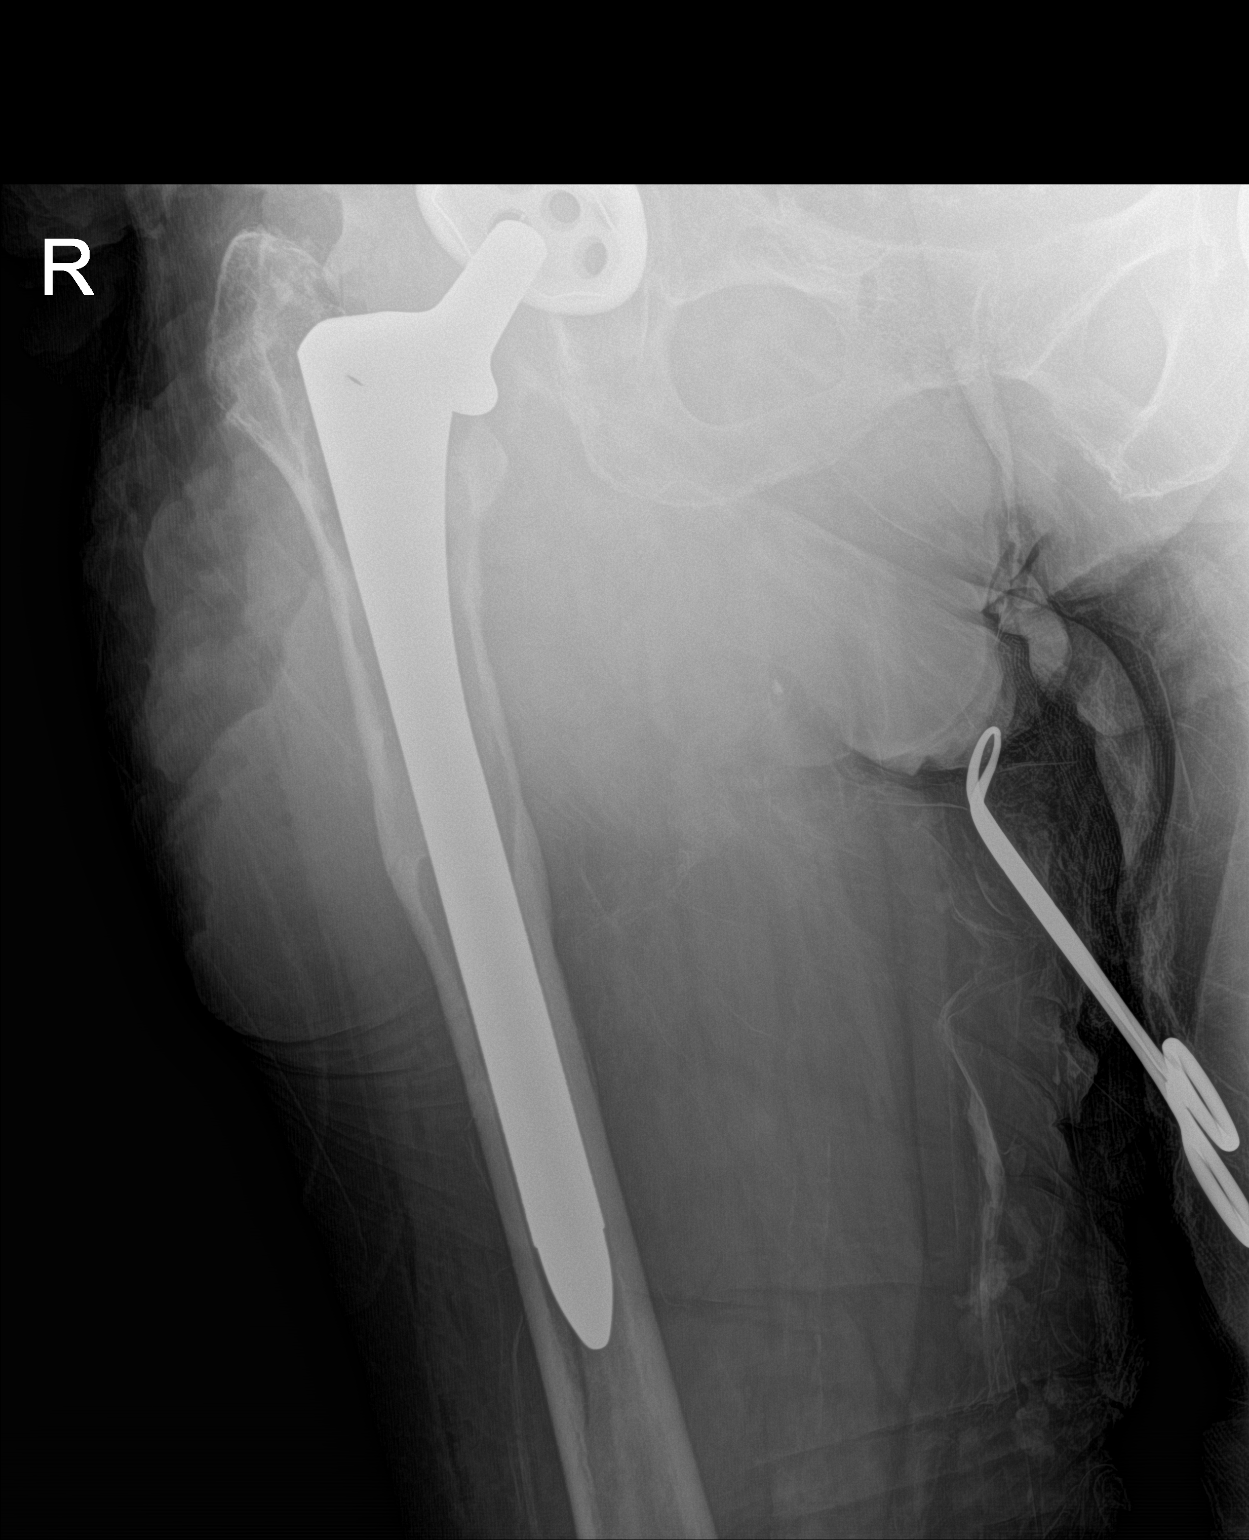

[2 of 2 positions shown; findings below may reference images not displayed]

FINDINGS: Post revision right total hip arthroplasty. A single acetabular
screw is present. Clamp overlies the medial thigh.
IMPRESSION: Post revision right total hip arthroplasty.

## 2020-12-04 IMAGING — DX DG PORTABLE PELVIS
2 series · 2 of 2 positions shown · non-contrast
Comparison: 12/29/2018 at 8728 hours.

CLINICAL DATA: Right hip replacement.

EXAM:
PORTABLE PELVIS 1-2 VIEWS

[pelvis ap (1 of 2)]
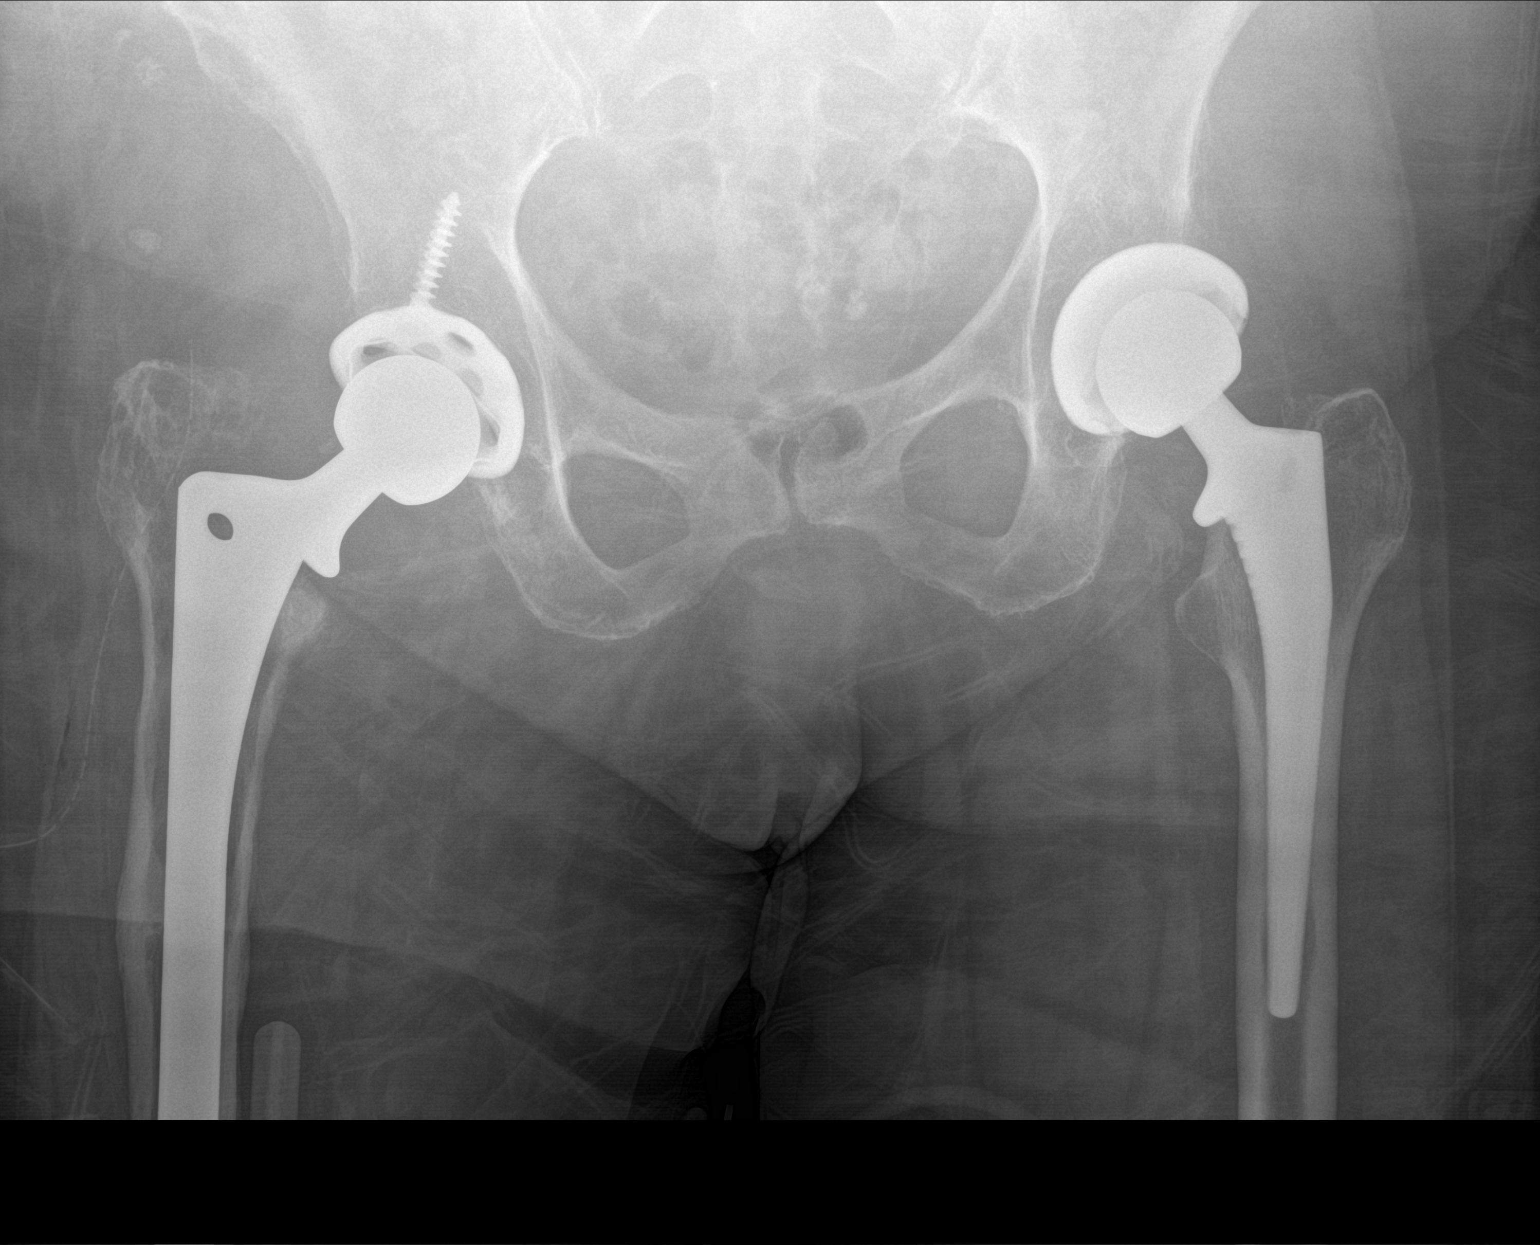

[pelvis ap (2 of 2)]
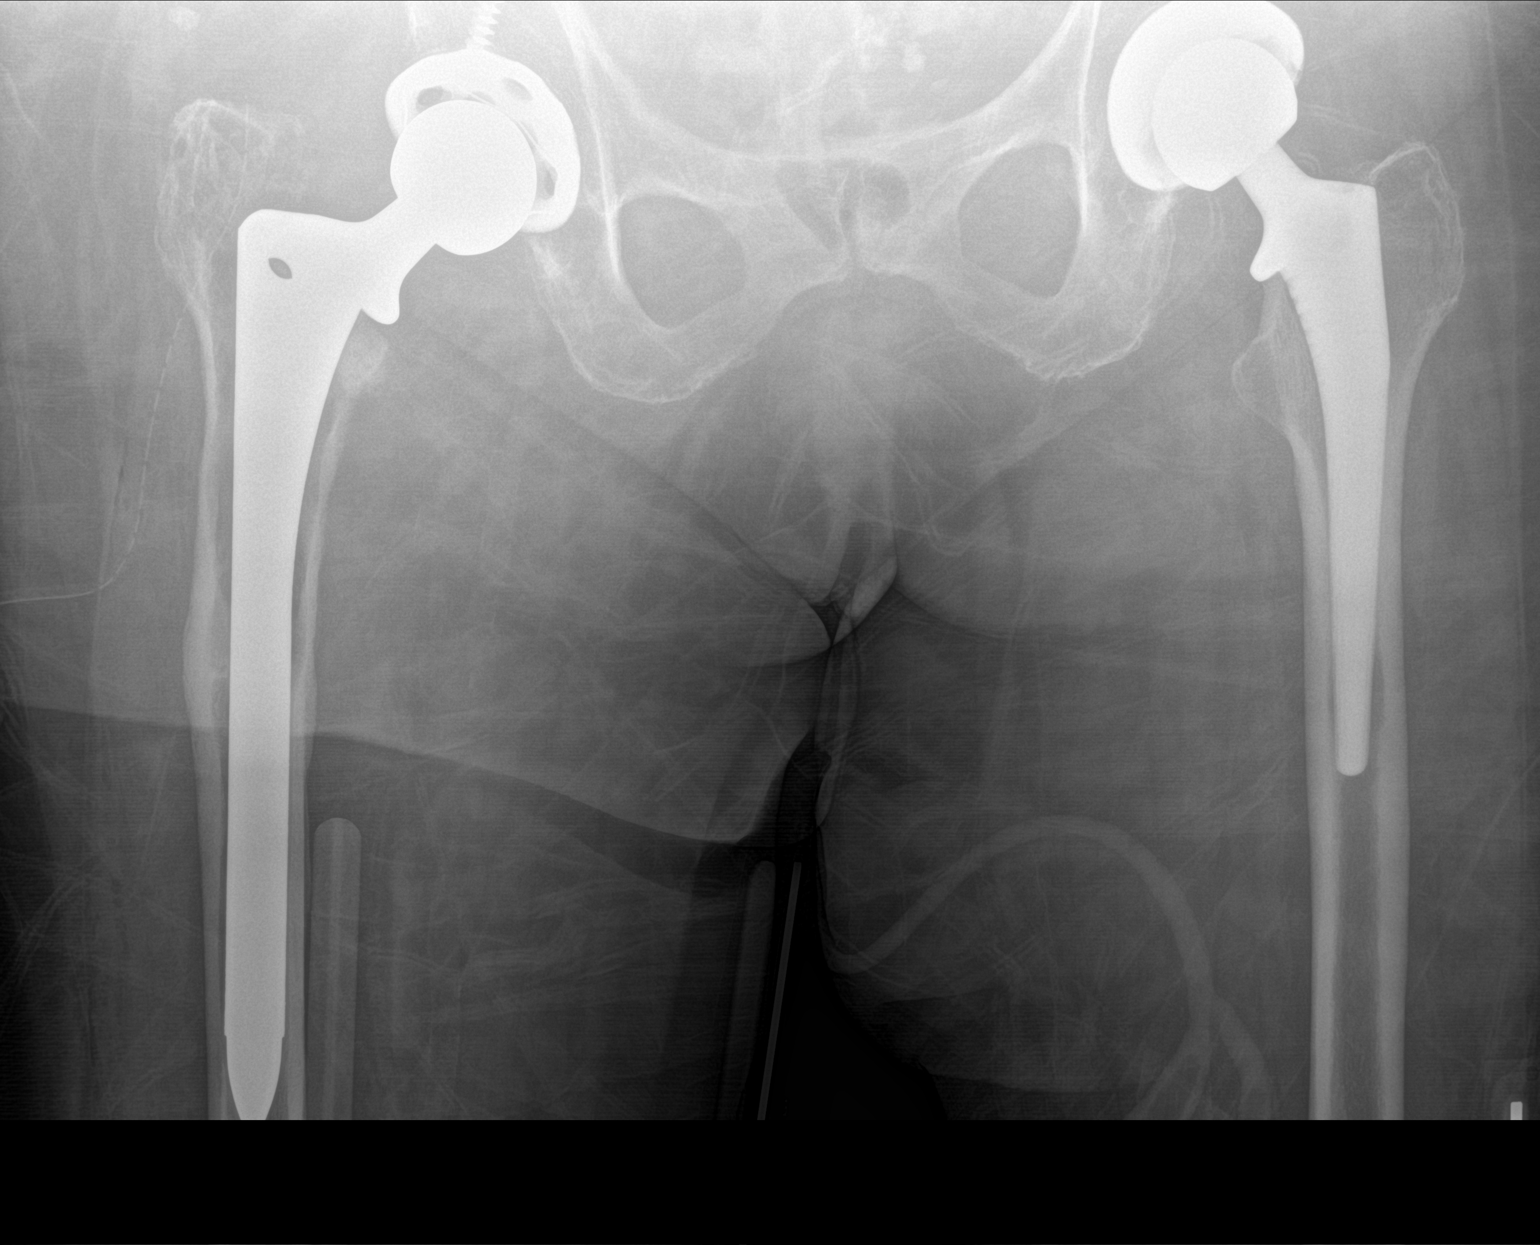

[2 of 2 positions shown; findings below may reference images not displayed]

FINDINGS: Right total hip arthroplasty. Femoral stem appears well seated.
Surgical drain is seen laterally. Previous left hip arthroplasty is
noted.
IMPRESSION: Right hip arthroplasty without complicating feature.
